# Patient Record
Sex: Male | Born: 1965 | Hispanic: Yes | Marital: Married | Smoking: Never smoker
Health system: Southern US, Community
[De-identification: ages and names within clinical notes are randomized; demographics above are authoritative.]

## PROBLEM LIST (undated history)

## (undated) DIAGNOSIS — C801 Malignant (primary) neoplasm, unspecified: Secondary | ICD-10-CM

## (undated) DIAGNOSIS — Z789 Other specified health status: Secondary | ICD-10-CM

## (undated) DIAGNOSIS — Z973 Presence of spectacles and contact lenses: Secondary | ICD-10-CM

## (undated) HISTORY — PX: SIGMOIDOSCOPY: SUR1295

## (undated) HISTORY — PX: TONSILLECTOMY: SUR1361

## (undated) HISTORY — PX: OTHER SURGICAL HISTORY: SHX169

## (undated) HISTORY — DX: Malignant (primary) neoplasm, unspecified: C80.1

## (undated) HISTORY — PX: WISDOM TOOTH EXTRACTION: SHX21

---

## 2012-11-13 ENCOUNTER — Encounter (INDEPENDENT_AMBULATORY_CARE_PROVIDER_SITE_OTHER): Payer: Self-pay

## 2012-11-21 ENCOUNTER — Encounter (INDEPENDENT_AMBULATORY_CARE_PROVIDER_SITE_OTHER): Payer: Self-pay | Admitting: General Surgery

## 2012-11-21 ENCOUNTER — Ambulatory Visit (INDEPENDENT_AMBULATORY_CARE_PROVIDER_SITE_OTHER): Payer: BC Managed Care – PPO | Admitting: General Surgery

## 2012-11-21 VITALS — BP 118/82 | HR 72 | Temp 98.6°F | Resp 18 | Ht 72.0 in | Wt 187.5 lb

## 2012-11-21 DIAGNOSIS — C4359 Malignant melanoma of other part of trunk: Secondary | ICD-10-CM

## 2012-11-21 NOTE — Patient Instructions (Addendum)
We will obtain lymphoscintigram and then plan on scheduling wide excision with sentinel lymph node biopsy

## 2012-11-21 NOTE — Progress Notes (Signed)
Patient ID: James Burns, male   DOB: [birth year], 47 y.o.   MRN: [id number]  Chief Complaint  Patient presents with  . Melanoma    HPI James Burns is a 47 y.o. male.  Chief complaint: Melanoma of right back HPI  Patient's wife is a Engineer, civil (consulting) and she noticed a mole that was bleeding on his back. This was just to the right of the midline. He went to have that checked by Dr. Yetta Barre. That was found to be a seborrheic keratosis. However, Dr. Yetta Barre noted another lesion more lateral on his right back. This was biopsied and came back malignant melanoma, superficial spreading type, restless measurement 0.62 mm, margins positive peripheral and deep area. I was asked to see him in consultation regarding surgical management and lymph node evaluation. Patient denies any complaints in the area.  History reviewed. No pertinent past medical history.  History reviewed. No pertinent past surgical history.  No family history on file.  Social History History  Substance Use Topics  . Smoking status: Never Smoker   . Smokeless tobacco: Not on file  . Alcohol Use: No    No Known Allergies  No current outpatient prescriptions on file.   No current facility-administered medications for this visit.    Review of Systems Review of Systems  Constitutional: Negative for fever, chills and unexpected weight change.  HENT: Negative for hearing loss, congestion, sore throat, trouble swallowing and voice change.   Eyes: Negative for visual disturbance.  Respiratory: Negative for cough and wheezing.   Cardiovascular: Negative for chest pain, palpitations and leg swelling.  Gastrointestinal: Negative for nausea, vomiting, abdominal pain, diarrhea, constipation, blood in stool, abdominal distention, anal bleeding and rectal pain.  Genitourinary: Negative for hematuria and difficulty urinating.  Musculoskeletal: Negative for arthralgias.  Skin: Positive for wound.       See history of present illness    Neurological: Negative for seizures, syncope, weakness and headaches.  Hematological: Negative for adenopathy. Does not bruise/bleed easily.  Psychiatric/Behavioral: Negative for confusion.    Blood pressure 118/82, pulse 72, temperature 98.6 F (37 C), temperature source Oral, resp. rate 18, height 6' (1.829 m), weight 187 lb 8 oz (85.049 kg).  Physical Exam Physical Exam  Constitutional: He is oriented to person, place, and time. He appears well-developed and well-nourished. No distress.  HENT:  Head: Normocephalic and atraumatic.  Mouth/Throat: Oropharynx is clear and moist. No oropharyngeal exudate.  Eyes: EOM are normal. Pupils are equal, round, and reactive to light. Right eye exhibits no discharge. Left eye exhibits no discharge. No scleral icterus.  Neck: Normal range of motion. Neck supple. No tracheal deviation present.  Cardiovascular: Normal rate, regular rhythm, normal heart sounds and intact distal pulses.   Pulmonary/Chest: Effort normal and breath sounds normal. No stridor. No respiratory distress. He has no wheezes. He has no rales.    Biopsy site right mid back lateral to the seborrheic keratosis, no surrounding nodularity, a picture was taken and is listed separately in EPIC, no evidence of infection   Abdominal: Soft. Bowel sounds are normal. He exhibits no distension and no mass. There is no tenderness. There is no rebound and no guarding.  Musculoskeletal: Normal range of motion.  Neurological: He is alert and oriented to person, place, and time. He exhibits normal muscle tone. Coordination normal.  Skin: He is not diaphoretic.  See above, scattered nevi on the trunk  Lymphatic exam: No bilateral cervical, supraclavicular, axillary, or inguinal lymphadenopathy  Data Reviewed Office note and pathology  report from Dr. Yetta Barre  Assessment    Melanoma right back, at least 0.62 mm in thickness    Plan    We'll obtain lymphoscintigram to evaluate location of  sentinel node. We'll plan wide excision melanoma right back with sentinel lymph node biopsy, likely from the right axilla. The location of the sentinel node biopsy will be guided by the  lymphoscintigram. We will schedule this for next week.  Procedure, risks, and benefits were discussed in detail with the patient. We also discussed the disease process in detail. I gave him some literature from the [employer]. I answered his questions and his wife's questions. I look forward to proceeding with surgery.       THOMPSON,BURKE E 11/21/2012, 11:38 AM

## 2012-11-23 ENCOUNTER — Encounter (HOSPITAL_BASED_OUTPATIENT_CLINIC_OR_DEPARTMENT_OTHER): Payer: Self-pay | Admitting: *Deleted

## 2012-11-23 NOTE — Progress Notes (Signed)
Talked with wife-she is nurse-they moved from [facility] No labs needed

## 2012-11-28 ENCOUNTER — Encounter (HOSPITAL_COMMUNITY)
Admission: RE | Admit: 2012-11-28 | Discharge: 2012-11-28 | Disposition: A | Discharge status: Home / Self Care | Payer: BC Managed Care – PPO | Source: Ambulatory Visit | Attending: General Surgery | Admitting: General Surgery

## 2012-11-28 DIAGNOSIS — C4359 Malignant melanoma of other part of trunk: Secondary | ICD-10-CM

## 2012-11-28 MED ORDER — TECHNETIUM TC 99M SULFUR COLLOID FILTERED
0.5000 | Freq: Once | INTRAVENOUS | Status: AC | PRN
  Administered 2012-11-28: 0.5 via INTRADERMAL

## 2012-11-29 ENCOUNTER — Telehealth: Payer: Self-pay | Admitting: General Surgery

## 2012-11-29 NOTE — Telephone Encounter (Signed)
I called patient and told him the results of his lymphscintigram.  Surgery tomorrow as planned.

## 2012-11-30 ENCOUNTER — Ambulatory Visit (HOSPITAL_BASED_OUTPATIENT_CLINIC_OR_DEPARTMENT_OTHER)
Admission: RE | Admit: 2012-11-30 | Discharge: 2012-11-30 | Disposition: A | Discharge status: Home / Self Care | Payer: BC Managed Care – PPO | Source: Ambulatory Visit | Attending: General Surgery | Admitting: General Surgery

## 2012-11-30 ENCOUNTER — Encounter (HOSPITAL_COMMUNITY)
Admission: RE | Admit: 2012-11-30 | Discharge: 2012-11-30 | Disposition: A | Discharge status: Home / Self Care | Payer: BC Managed Care – PPO | Source: Ambulatory Visit | Attending: General Surgery | Admitting: General Surgery

## 2012-11-30 ENCOUNTER — Encounter (HOSPITAL_BASED_OUTPATIENT_CLINIC_OR_DEPARTMENT_OTHER): Payer: Self-pay | Admitting: *Deleted

## 2012-11-30 ENCOUNTER — Encounter (HOSPITAL_BASED_OUTPATIENT_CLINIC_OR_DEPARTMENT_OTHER): Payer: Self-pay | Admitting: Anesthesiology

## 2012-11-30 ENCOUNTER — Ambulatory Visit (HOSPITAL_BASED_OUTPATIENT_CLINIC_OR_DEPARTMENT_OTHER): Payer: BC Managed Care – PPO | Admitting: Anesthesiology

## 2012-11-30 ENCOUNTER — Telehealth (INDEPENDENT_AMBULATORY_CARE_PROVIDER_SITE_OTHER): Payer: Self-pay | Admitting: General Surgery

## 2012-11-30 ENCOUNTER — Encounter (HOSPITAL_BASED_OUTPATIENT_CLINIC_OR_DEPARTMENT_OTHER)
Admission: RE | Disposition: A | Discharge status: Home / Self Care | Payer: Self-pay | Source: Ambulatory Visit | Attending: General Surgery

## 2012-11-30 DIAGNOSIS — C4359 Malignant melanoma of other part of trunk: Secondary | ICD-10-CM

## 2012-11-30 HISTORY — PX: MELANOMA EXCISION WITH SENTINEL LYMPH NODE BIOPSY: SHX5267

## 2012-11-30 HISTORY — DX: Other specified health status: Z78.9

## 2012-11-30 HISTORY — DX: Presence of spectacles and contact lenses: Z97.3

## 2012-11-30 LAB — POCT HEMOGLOBIN-HEMACUE: Hemoglobin: 16 g/dL (ref 13.0–17.0)

## 2012-11-30 SURGERY — MELANOMA EXCISION WITH SENTINEL LYMPH NODE BIOPSY
Anesthesia: General | Site: Back | Laterality: Right | Wound class: Clean

## 2012-11-30 MED ORDER — CHLORHEXIDINE GLUCONATE 4 % EX LIQD: 1.0000 "application " | Freq: Once | CUTANEOUS | Status: DC

## 2012-11-30 MED ORDER — FENTANYL CITRATE 0.05 MG/ML IJ SOLN
INTRAMUSCULAR | Status: DC | PRN
  Administered 2012-11-30 (×4): 25 ug via INTRAVENOUS

## 2012-11-30 MED ORDER — FENTANYL CITRATE 0.05 MG/ML IJ SOLN
50.0000 ug | INTRAMUSCULAR | Status: DC | PRN
  Administered 2012-11-30: 100 ug via INTRAVENOUS

## 2012-11-30 MED ORDER — BUPIVACAINE-EPINEPHRINE 0.5% -1:200000 IJ SOLN
INTRAMUSCULAR | Status: DC | PRN
  Administered 2012-11-30: 20 mL
  Administered 2012-11-30: 6 mL

## 2012-11-30 MED ORDER — SUCCINYLCHOLINE CHLORIDE 20 MG/ML IJ SOLN
INTRAMUSCULAR | Status: DC | PRN
  Administered 2012-11-30: 100 mg via INTRAVENOUS

## 2012-11-30 MED ORDER — OXYCODONE-ACETAMINOPHEN 5-325 MG PO TABS: 1.0000 | ORAL_TABLET | Freq: Four times a day (QID) | ORAL | Status: DC | PRN

## 2012-11-30 MED ORDER — DEXAMETHASONE SODIUM PHOSPHATE 4 MG/ML IJ SOLN
INTRAMUSCULAR | Status: DC | PRN
  Administered 2012-11-30: 10 mg via INTRAVENOUS

## 2012-11-30 MED ORDER — OXYCODONE HCL 5 MG PO TABS
5.0000 mg | ORAL_TABLET | Freq: Once | ORAL | Status: AC | PRN
  Administered 2012-11-30: 5 mg via ORAL

## 2012-11-30 MED ORDER — PROPOFOL 10 MG/ML IV BOLUS
INTRAVENOUS | Status: DC | PRN
  Administered 2012-11-30: 160 mg via INTRAVENOUS

## 2012-11-30 MED ORDER — TECHNETIUM TC 99M SULFUR COLLOID FILTERED
1.0000 | Freq: Once | INTRAVENOUS | Status: AC | PRN
  Administered 2012-11-30: 1 via INTRADERMAL

## 2012-11-30 MED ORDER — LIDOCAINE HCL (CARDIAC) 10 MG/ML IV SOLN
INTRAVENOUS | Status: DC | PRN
  Administered 2012-11-30: 50 mg via INTRAVENOUS

## 2012-11-30 MED ORDER — OXYCODONE HCL 5 MG/5ML PO SOLN
5.0000 mg | Freq: Once | ORAL | Status: AC | PRN
Start: 2012-11-30 — End: 2012-11-30

## 2012-11-30 MED ORDER — MIDAZOLAM HCL 2 MG/2ML IJ SOLN
2.0000 mg | INTRAMUSCULAR | Status: DC | PRN
  Administered 2012-11-30: 2 mg via INTRAVENOUS

## 2012-11-30 MED ORDER — CEFAZOLIN SODIUM-DEXTROSE 2-3 GM-% IV SOLR
2.0000 g | INTRAVENOUS | Status: AC
  Administered 2012-11-30: 2 g via INTRAVENOUS

## 2012-11-30 MED ORDER — LACTATED RINGERS IV SOLN
INTRAVENOUS | Status: DC
  Administered 2012-11-30 (×3): via INTRAVENOUS

## 2012-11-30 MED ORDER — HYDROMORPHONE HCL PF 1 MG/ML IJ SOLN: 0.2500 mg | INTRAMUSCULAR | Status: DC | PRN

## 2012-11-30 MED ORDER — ONDANSETRON HCL 4 MG/2ML IJ SOLN
INTRAMUSCULAR | Status: DC | PRN
  Administered 2012-11-30: 4 mg via INTRAVENOUS

## 2012-11-30 MED ORDER — METHYLENE BLUE 1 % INJ SOLN
INTRAMUSCULAR | Status: DC | PRN
  Administered 2012-11-30: 2 mL via INTRADERMAL

## 2012-11-30 SURGICAL SUPPLY — 62 items
APPLIER CLIP 11 MED OPEN (CLIP)
APPLIER CLIP 9.375 MED OPEN (MISCELLANEOUS)
BANDAGE GAUZE ELAST BULKY 4 IN (GAUZE/BANDAGES/DRESSINGS) IMPLANT
BENZOIN TINCTURE PRP APPL 2/3 (GAUZE/BANDAGES/DRESSINGS) IMPLANT
BLADE HEX COATED 2.75 (ELECTRODE) ×2 IMPLANT
BLADE SURG 10 STRL SS (BLADE) IMPLANT
BLADE SURG 15 STRL LF DISP TIS (BLADE) ×1 IMPLANT
BLADE SURG 15 STRL SS (BLADE) ×1
BLADE SURG ROTATE 9660 (MISCELLANEOUS) ×4 IMPLANT
CANISTER SUCTION 1200CC (MISCELLANEOUS) ×2 IMPLANT
CHLORAPREP W/TINT 26ML (MISCELLANEOUS) ×4 IMPLANT
CLIP APPLIE 11 MED OPEN (CLIP) IMPLANT
CLIP APPLIE 9.375 MED OPEN (MISCELLANEOUS) IMPLANT
CLOTH BEACON ORANGE TIMEOUT ST (SAFETY) ×2 IMPLANT
COVER MAYO STAND STRL (DRAPES) ×2 IMPLANT
COVER PROBE W GEL 5X96 (DRAPES) ×2 IMPLANT
COVER TABLE BACK 60X90 (DRAPES) ×2 IMPLANT
DECANTER SPIKE VIAL GLASS SM (MISCELLANEOUS) IMPLANT
DERMABOND ADVANCED (GAUZE/BANDAGES/DRESSINGS) ×1
DERMABOND ADVANCED .7 DNX12 (GAUZE/BANDAGES/DRESSINGS) ×1 IMPLANT
DRAPE LAPAROSCOPIC ABDOMINAL (DRAPES) ×2 IMPLANT
DRAPE PED LAPAROTOMY (DRAPES) ×2 IMPLANT
DRAPE UTILITY XL STRL (DRAPES) ×4 IMPLANT
ELECT REM PT RETURN 9FT ADLT (ELECTROSURGICAL) ×2
ELECTRODE REM PT RTRN 9FT ADLT (ELECTROSURGICAL) ×1 IMPLANT
GAUZE SPONGE 4X4 12PLY STRL LF (GAUZE/BANDAGES/DRESSINGS) IMPLANT
GAUZE XEROFORM 1X8 LF (GAUZE/BANDAGES/DRESSINGS) IMPLANT
GLOVE BIO SURGEON STRL SZ 6.5 (GLOVE) ×2 IMPLANT
GLOVE BIO SURGEON STRL SZ8 (GLOVE) ×6 IMPLANT
GLOVE BIOGEL PI IND STRL 8 (GLOVE) ×2 IMPLANT
GLOVE BIOGEL PI INDICATOR 8 (GLOVE) ×2
GLOVE EXAM NITRILE MD LF STRL (GLOVE) ×2 IMPLANT
GOWN PREVENTION PLUS XLARGE (GOWN DISPOSABLE) ×6 IMPLANT
GOWN PREVENTION PLUS XXLARGE (GOWN DISPOSABLE) IMPLANT
NDL SAFETY ECLIPSE 18X1.5 (NEEDLE) ×1 IMPLANT
NEEDLE HYPO 18GX1.5 SHARP (NEEDLE) ×1
NEEDLE HYPO 25X1 1.5 SAFETY (NEEDLE) ×4 IMPLANT
NEEDLE HYPO 25X5/8 SAFETYGLIDE (NEEDLE) IMPLANT
NS IRRIG 1000ML POUR BTL (IV SOLUTION) ×2 IMPLANT
PACK BASIN DAY SURGERY FS (CUSTOM PROCEDURE TRAY) ×2 IMPLANT
PENCIL BUTTON HOLSTER BLD 10FT (ELECTRODE) ×2 IMPLANT
SLEEVE SCD COMPRESS KNEE MED (MISCELLANEOUS) ×2 IMPLANT
SPONGE GAUZE 4X4 12PLY (GAUZE/BANDAGES/DRESSINGS) ×2 IMPLANT
SPONGE LAP 4X18 X RAY DECT (DISPOSABLE) ×2 IMPLANT
STAPLER VISISTAT 35W (STAPLE) IMPLANT
STOCKINETTE IMPERVIOUS LG (DRAPES) IMPLANT
STRIP CLOSURE SKIN 1/2X4 (GAUZE/BANDAGES/DRESSINGS) IMPLANT
STRIP CLOSURE SKIN 1/4X4 (GAUZE/BANDAGES/DRESSINGS) IMPLANT
SUT ETHILON 3 0 FSL (SUTURE) ×4 IMPLANT
SUT ETHILON 4 0 PS 2 18 (SUTURE) IMPLANT
SUT MON AB 4-0 PC3 18 (SUTURE) ×2 IMPLANT
SUT SILK 2 0 SH (SUTURE) ×2 IMPLANT
SUT VIC AB 2-0 SH 27 (SUTURE) ×2
SUT VIC AB 2-0 SH 27XBRD (SUTURE) ×2 IMPLANT
SUT VIC AB 3-0 SH 27 (SUTURE) ×2
SUT VIC AB 3-0 SH 27X BRD (SUTURE) ×2 IMPLANT
SYR CONTROL 10ML LL (SYRINGE) ×4 IMPLANT
SYR TB 1ML LL NO SAFETY (SYRINGE) IMPLANT
TOWEL OR 17X24 6PK STRL BLUE (TOWEL DISPOSABLE) ×2 IMPLANT
TOWEL OR NON WOVEN STRL DISP B (DISPOSABLE) ×2 IMPLANT
TUBE CONNECTING 20X1/4 (TUBING) ×2 IMPLANT
YANKAUER SUCT BULB TIP NO VENT (SUCTIONS) ×2 IMPLANT

## 2012-11-30 NOTE — Transfer of Care (Signed)
Immediate Anesthesia Transfer of Care Note  Patient: James Burns  Procedure(s) Performed: Procedure(s) with comments: MELANOMA wide EXCISION right back  WITH SENTINEL LYMPH NODE BIOPSY nuclear medicine injection 7:00 (Right) - melanoma nuclear medicine injection at 7:00 per Sheralyn Boatman   Patient Location: PACU  Anesthesia Type:General  Level of Consciousness: sedated and patient cooperative  Airway & Oxygen Therapy: Patient Spontanous Breathing and Patient connected to face mask oxygen  Post-op Assessment: Report given to PACU RN and Post -op Vital signs reviewed and stable  Post vital signs: Reviewed and stable  Complications: No apparent anesthesia complications

## 2012-11-30 NOTE — Telephone Encounter (Signed)
Left message on this patients voicemail po f/u is 12/19/12 9:15am

## 2012-11-30 NOTE — H&P (View-Only) (Signed)
Patient ID: James Burns, male   DOB: 12/26/1965, 47 y.o.   MRN: [id number]  Chief Complaint  Patient presents with  . Melanoma    HPI Printice Korff is a 47 y.o. male.  Chief complaint: Melanoma of right back HPI  Patient's wife is a nurse and she noticed a mole that was bleeding on his back. This was just to the right of the midline. He went to have that checked by Dr. Jones. That was found to be a seborrheic keratosis. However, Dr. Jones noted another lesion more lateral on his right back. This was biopsied and came back malignant melanoma, superficial spreading type, restless measurement 0.62 mm, margins positive peripheral and deep area. I was asked to see him in consultation regarding surgical management and lymph node evaluation. Patient denies any complaints in the area.  History reviewed. No pertinent past medical history.  History reviewed. No pertinent past surgical history.  No family history on file.  Social History History  Substance Use Topics  . Smoking status: Never Smoker   . Smokeless tobacco: Not on file  . Alcohol Use: No    No Known Allergies  No current outpatient prescriptions on file.   No current facility-administered medications for this visit.    Review of Systems Review of Systems  Constitutional: Negative for fever, chills and unexpected weight change.  HENT: Negative for hearing loss, congestion, sore throat, trouble swallowing and voice change.   Eyes: Negative for visual disturbance.  Respiratory: Negative for cough and wheezing.   Cardiovascular: Negative for chest pain, palpitations and leg swelling.  Gastrointestinal: Negative for nausea, vomiting, abdominal pain, diarrhea, constipation, blood in stool, abdominal distention, anal bleeding and rectal pain.  Genitourinary: Negative for hematuria and difficulty urinating.  Musculoskeletal: Negative for arthralgias.  Skin: Positive for wound.       See history of present illness    Neurological: Negative for seizures, syncope, weakness and headaches.  Hematological: Negative for adenopathy. Does not bruise/bleed easily.  Psychiatric/Behavioral: Negative for confusion.    Blood pressure 118/82, pulse 72, temperature 98.6 F (37 C), temperature source Oral, resp. rate 18, height 6' (1.829 m), weight 187 lb 8 oz (85.049 kg).  Physical Exam Physical Exam  Constitutional: He is oriented to person, place, and time. He appears well-developed and well-nourished. No distress.  HENT:  Head: Normocephalic and atraumatic.  Mouth/Throat: Oropharynx is clear and moist. No oropharyngeal exudate.  Eyes: EOM are normal. Pupils are equal, round, and reactive to light. Right eye exhibits no discharge. Left eye exhibits no discharge. No scleral icterus.  Neck: Normal range of motion. Neck supple. No tracheal deviation present.  Cardiovascular: Normal rate, regular rhythm, normal heart sounds and intact distal pulses.   Pulmonary/Chest: Effort normal and breath sounds normal. No stridor. No respiratory distress. He has no wheezes. He has no rales.    Biopsy site right mid back lateral to the seborrheic keratosis, no surrounding nodularity, a picture was taken and is listed separately in EPIC, no evidence of infection   Abdominal: Soft. Bowel sounds are normal. He exhibits no distension and no mass. There is no tenderness. There is no rebound and no guarding.  Musculoskeletal: Normal range of motion.  Neurological: He is alert and oriented to person, place, and time. He exhibits normal muscle tone. Coordination normal.  Skin: He is not diaphoretic.  See above, scattered nevi on the trunk  Lymphatic exam: No bilateral cervical, supraclavicular, axillary, or inguinal lymphadenopathy  Data Reviewed Office note and pathology   report from Dr. Jones  Assessment    Melanoma right back, at least 0.62 mm in thickness    Plan    We'll obtain lymphoscintigram to evaluate location of  sentinel node. We'll plan wide excision melanoma right back with sentinel lymph node biopsy, likely from the right axilla. The location of the sentinel node biopsy will be guided by the  lymphoscintigram. We will schedule this for next week.  Procedure, risks, and benefits were discussed in detail with the patient. We also discussed the disease process in detail. I gave him some literature from the [employer] Society. I answered his questions and his wife's questions. I look forward to proceeding with surgery.       THOMPSON,BURKE E 11/21/2012, 11:38 AM    

## 2012-11-30 NOTE — Anesthesia Procedure Notes (Signed)
Procedure Name: Intubation Date/Time: 11/30/2012 8:51 AM Performed by: Gar Gibbon Pre-anesthesia Checklist: Patient identified, Emergency Drugs available, Suction available and Patient being monitored Patient Re-evaluated:Patient Re-evaluated prior to inductionOxygen Delivery Method: Circle System Utilized Preoxygenation: Pre-oxygenation with 100% oxygen Intubation Type: IV induction Ventilation: Mask ventilation without difficulty Laryngoscope Size: Miller and 3 Grade View: Grade II Tube type: Oral Tube size: 8.0 mm Number of attempts: 1 Airway Equipment and Method: stylet and oral airway Placement Confirmation: ETT inserted through vocal cords under direct vision,  positive ETCO2 and breath sounds checked- equal and bilateral Secured at: 22 cm Tube secured with: Tape Dental Injury: Teeth and Oropharynx as per pre-operative assessment

## 2012-11-30 NOTE — Op Note (Signed)
11/30/2012  10:17 AM  PATIENT:  James Burns  47 y.o. male  PRE-OPERATIVE DIAGNOSIS:  melanoma right back   POST-OPERATIVE DIAGNOSIS:  melanoma right back  PROCEDURE:  Procedure(s): MELANOMA wide EXCISION right back 10x4.5cm with layered closure WITH SENTINEL LYMPH NODE BIOPSY right axilla with blue dye injection, nuclear medicine injection 7:00  SURGEON:  Surgeon(s): Liz Malady, MD  PHYSICIAN ASSISTANT:   ASSISTANTS: none   ANESTHESIA:   local and general  EBL:  Total I/O In: 1000 [I.V.:1000] Out: -   BLOOD ADMINISTERED:none  DRAINS: none   SPECIMEN:  Excision  DISPOSITION OF SPECIMEN:  PATHOLOGY  COUNTS:  YES  DICTATION: .Dragon Dictation  Patient presented for wide excision melanoma right back with right axillary sentinel lymph node biopsy. Lymphoscintigram revealed sentinel node in the right axilla. He was identified in the preop holding area. Informed consent was obtained. His sites were both marked. He was brought to the operating room. He received intravenous antibiotics. General endotracheal anesthesia was administered by the anesthesia staff. We did a time out procedure. Methylene blue diluted with saline was injected cutaneously around his melanoma site and this was massaged for a couple minutes. Next, his right axilla was prepped and draped in sterile fashion. Neoprobe was used to locate sentinel node. Transverse incision was made. Subcutaneous tissues were dissected down with cautery. Axillary fat was entered. Using the neoprobe, a hot blue node was encountered. This was carefully dissected using Bovie cautery. Dissection stayed centrally and away from Long thoracic and thoracodorsal nerves. Hot blue node was circumferentially dissected and removed. Cautery was used on small vessels and lymphatics. Left eye was sent to pathology. The axilla was further checked with the neoprobe. No readings above 10% of the previous node reading which was 650 were obtained.  Wound was copiously irrigated. Hemostasis was ensured. Local anesthetic was injected. Subcutaneous tissues were approximated with running 3-0 Vicryl. Skin was closed with running 4-0 Monocryl subcuticular followed by Dermabond. Counts were correct for this portion. Next the patient was undraped and repositioned. His back was prepped and draped in sterile fashion. I changed my gown and gloves. An area was measured out around the biopsy site on his back giving at least approximately 2 cm circumferential margin. An elliptical incision was then made, 10 x 4.5 cm along tissue planes to facilitate closure. The incision was continued down through subcutaneous fat and to the fascia. This Tissue was removed in its entirety down to the fascial level. It was marked for orientation with silk suture for pathology and passed off. We changed our gloves. Flaps were raised along the fascia superiorly and inferiorly using cautery. Good hemostasis was obtained. Local anesthetic was injected. The wound was then closed with deep tissues approximated with interrupted 2-0 Vicryl sutures. Skin was closed with interrupted 3-0 nylon sutures. It came together nicely without excessive tension. There was excellent hemostasis. Sterile dressing was applied. Sponge, needle, and as we counts were all correct. Patient tolerated procedure well without apparent complications to recovery in stable condition.  PATIENT DISPOSITION:  PACU - hemodynamically stable.   Delay start of Pharmacological VTE agent (>24hrs) due to surgical blood loss or risk of bleeding:  no  Violeta Gelinas, MD, MPH, FACS Pager: [phone]  3/14/201410:17 AM

## 2012-11-30 NOTE — Anesthesia Preprocedure Evaluation (Signed)
Anesthesia Evaluation  Patient identified by MRN, date of birth, ID band Patient awake    Reviewed: Allergy & Precautions, H&P , NPO status , Patient's Chart, lab work & pertinent test results  Airway Mallampati: II TM Distance: >3 FB Neck ROM: Full    Dental no notable dental hx. (+) Teeth Intact and Dental Advisory Given   Pulmonary neg pulmonary ROS,  breath sounds clear to auscultation  Pulmonary exam normal       Cardiovascular negative cardio ROS  Rhythm:Regular Rate:Normal     Neuro/Psych negative neurological ROS  negative psych ROS   GI/Hepatic negative GI ROS, Neg liver ROS,   Endo/Other  negative endocrine ROS  Renal/GU negative Renal ROS  negative genitourinary   Musculoskeletal   Abdominal   Peds  Hematology negative hematology ROS (+)   Anesthesia Other Findings   Reproductive/Obstetrics negative OB ROS                           Anesthesia Physical Anesthesia Plan  ASA: I  Anesthesia Plan: General   Post-op Pain Management:    Induction: Intravenous  Airway Management Planned: Oral ETT  Additional Equipment:   Intra-op Plan:   Post-operative Plan: Extubation in OR  Informed Consent: I have reviewed the patients History and Physical, chart, labs and discussed the procedure including the risks, benefits and alternatives for the proposed anesthesia with the patient or authorized representative who has indicated his/her understanding and acceptance.   Dental advisory given  Plan Discussed with: CRNA  Anesthesia Plan Comments:         Anesthesia Quick Evaluation  

## 2012-11-30 NOTE — Interval H&P Note (Signed)
History and Physical Interval Note:  11/30/2012 8:12 AM  James Burns  has presented today for surgery, with the diagnosis of melanoma right back   The various methods of treatment have been discussed with the patient and family. After consideration of risks, benefits and other options for treatment, the patient has consented to  Procedure(s) with comments: MELANOMA wide EXCISION right back  WITH SENTINEL LYMPH NODE BIOPSY nuclear medicine injection 7:00 (Right) - melanoma nuclear medicine injection at 7:00 per Sheralyn Boatman  as a surgical intervention .  The patient's history has been reviewed, patient re-examined, site marked, no change in status, stable for surgery.  I have reviewed the patient's chart and labs.  Questions were answered to the patient's satisfaction.     THOMPSON,BURKE E

## 2012-11-30 NOTE — Anesthesia Postprocedure Evaluation (Signed)
  Anesthesia Post-op Note  Patient: James Burns  Procedure(s) Performed: Procedure(s) with comments: MELANOMA wide EXCISION right back  WITH SENTINEL LYMPH NODE BIOPSY nuclear medicine injection 7:00 (Right) - melanoma nuclear medicine injection at 7:00 per Sheralyn Boatman   Patient Location: PACU  Anesthesia Type:General  Level of Consciousness: awake and alert   Airway and Oxygen Therapy: Patient Spontanous Breathing and Patient connected to face mask oxygen  Post-op Pain: none  Post-op Assessment: Post-op Vital signs reviewed, Patient's Cardiovascular Status Stable, Respiratory Function Stable, Patent Airway and No signs of Nausea or vomiting  Post-op Vital Signs: Reviewed and stable  Complications: No apparent anesthesia complications

## 2012-12-03 ENCOUNTER — Encounter (HOSPITAL_BASED_OUTPATIENT_CLINIC_OR_DEPARTMENT_OTHER): Payer: Self-pay | Admitting: General Surgery

## 2012-12-06 ENCOUNTER — Telehealth (INDEPENDENT_AMBULATORY_CARE_PROVIDER_SITE_OTHER): Payer: Self-pay

## 2012-12-06 NOTE — Telephone Encounter (Signed)
I called the pt and let him know about his pathology results.  He has follow up 4/2

## 2012-12-19 ENCOUNTER — Ambulatory Visit (INDEPENDENT_AMBULATORY_CARE_PROVIDER_SITE_OTHER): Payer: BC Managed Care – PPO | Admitting: General Surgery

## 2012-12-19 ENCOUNTER — Encounter (INDEPENDENT_AMBULATORY_CARE_PROVIDER_SITE_OTHER): Payer: Self-pay | Admitting: General Surgery

## 2012-12-19 ENCOUNTER — Telehealth: Payer: Self-pay | Admitting: Hematology & Oncology

## 2012-12-19 VITALS — BP 122/84 | HR 69 | Temp 97.7°F | Resp 14 | Ht 72.0 in | Wt 186.4 lb

## 2012-12-19 DIAGNOSIS — C4359 Malignant melanoma of other part of trunk: Secondary | ICD-10-CM

## 2012-12-19 NOTE — Telephone Encounter (Signed)
Pt made 5-5 appointment, had other dates but he has plans and wanted this day.

## 2012-12-19 NOTE — Progress Notes (Signed)
Subjective:     Patient ID: James Burns, male   DOB: January 11, 1966, 47 y.o.   MRN: [id number]  HPI Patient is status post wide excision melanoma right back with right axillary sentinel lymph node biopsy. Pathology came back with lymph node negative and no evidence of residual melanoma. He is doing well postoperatively.  Review of Systems     Objective:   Physical Exam He is a small amount of edema in his right axilla near the wound but no evidence of infection. No significant seroma is present. Excision site on right back is well healed. Sutures were removed without difficulty. Benzoin and Steri-Strips was applied. No evidence of infection.    Assessment:     Status post wide excision melanoma right back with right axillary sentinel lymph node biopsy    Plan:     Favorable pathology as above. I will refer him to see Dr. Myna Hidalgo for medical oncology. I will see him back in one month.

## 2013-01-15 ENCOUNTER — Telehealth (INDEPENDENT_AMBULATORY_CARE_PROVIDER_SITE_OTHER): Payer: Self-pay

## 2013-01-15 ENCOUNTER — Telehealth: Payer: Self-pay | Admitting: Hematology & Oncology

## 2013-01-15 ENCOUNTER — Telehealth (INDEPENDENT_AMBULATORY_CARE_PROVIDER_SITE_OTHER): Payer: Self-pay | Admitting: General Surgery

## 2013-01-15 DIAGNOSIS — C4359 Malignant melanoma of other part of trunk: Secondary | ICD-10-CM

## 2013-01-15 NOTE — Telephone Encounter (Signed)
Patient called and cx 01/21/13 New patient apt.  He stated, he did not want to resch at this time

## 2013-01-15 NOTE — Telephone Encounter (Signed)
I called the pt back and he thinks Dr Gustavo Lah office is too far out for him to drive.  He would like to see someone closer to here.  I will discuss with Dr Janee Morn to see if there is an oncologist in [place] that he could see.

## 2013-01-15 NOTE — Telephone Encounter (Signed)
Message copied by Ivory Broad on Tue Jan 15, 2013  2:49 PM ------      Message from: Leanne Chang      Created: Tue Jan 15, 2013 10:14 AM      Regarding: Dr Janee Morn      Contact: [phone]       Patient states the referral given to him is too far and he would like a new one. May be e-mailed @ [email or web address] or called. ------

## 2013-01-15 NOTE — Telephone Encounter (Signed)
Spoke with this patient he is aware that the [facility] is looking for and open appt  Selena Batten said she would contact him with a day and time as well as contact us at [facility] . The patient is ok with that .

## 2013-01-18 ENCOUNTER — Telehealth: Payer: Self-pay | Admitting: Oncology

## 2013-01-18 NOTE — Telephone Encounter (Signed)
LVOM FOR PT TO RETURN CALL IN RE TO NP APPT.  °

## 2013-01-21 ENCOUNTER — Ambulatory Visit: Payer: BC Managed Care – PPO

## 2013-01-21 ENCOUNTER — Ambulatory Visit: Payer: BC Managed Care – PPO | Admitting: Hematology & Oncology

## 2013-01-21 ENCOUNTER — Other Ambulatory Visit: Payer: BC Managed Care – PPO | Admitting: Lab

## 2013-01-22 ENCOUNTER — Other Ambulatory Visit: Payer: Self-pay | Admitting: Oncology

## 2013-01-22 DIAGNOSIS — C4359 Malignant melanoma of other part of trunk: Secondary | ICD-10-CM

## 2013-01-23 ENCOUNTER — Telehealth: Payer: Self-pay | Admitting: Oncology

## 2013-01-23 ENCOUNTER — Encounter (INDEPENDENT_AMBULATORY_CARE_PROVIDER_SITE_OTHER): Payer: Self-pay | Admitting: General Surgery

## 2013-01-23 ENCOUNTER — Other Ambulatory Visit (HOSPITAL_BASED_OUTPATIENT_CLINIC_OR_DEPARTMENT_OTHER): Payer: BC Managed Care – PPO | Admitting: Lab

## 2013-01-23 ENCOUNTER — Ambulatory Visit (HOSPITAL_COMMUNITY)
Admission: RE | Admit: 2013-01-23 | Discharge: 2013-01-23 | Disposition: A | Discharge status: Home / Self Care | Payer: BC Managed Care – PPO | Source: Ambulatory Visit | Attending: Oncology | Admitting: Oncology

## 2013-01-23 ENCOUNTER — Ambulatory Visit: Payer: BC Managed Care – PPO

## 2013-01-23 ENCOUNTER — Encounter: Payer: Self-pay | Admitting: Oncology

## 2013-01-23 ENCOUNTER — Ambulatory Visit (INDEPENDENT_AMBULATORY_CARE_PROVIDER_SITE_OTHER): Payer: BC Managed Care – PPO | Admitting: General Surgery

## 2013-01-23 ENCOUNTER — Ambulatory Visit (HOSPITAL_BASED_OUTPATIENT_CLINIC_OR_DEPARTMENT_OTHER): Payer: BC Managed Care – PPO | Admitting: Oncology

## 2013-01-23 VITALS — BP 132/86 | HR 95 | Temp 98.5°F | Resp 18 | Ht 72.0 in | Wt 186.1 lb

## 2013-01-23 VITALS — BP 122/80 | HR 72 | Temp 97.9°F | Resp 18 | Ht 72.0 in | Wt 184.5 lb

## 2013-01-23 DIAGNOSIS — C4359 Malignant melanoma of other part of trunk: Secondary | ICD-10-CM

## 2013-01-23 LAB — CBC WITH DIFFERENTIAL/PLATELET
BASO%: 0.5 % (ref 0.0–2.0)
LYMPH%: 28.1 % (ref 14.0–49.0)
MCHC: 33.2 g/dL (ref 32.0–36.0)
MCV: 83.4 fL (ref 79.3–98.0)
MONO#: 0.6 10*3/uL (ref 0.1–0.9)
MONO%: 10.4 % (ref 0.0–14.0)
Platelets: 211 10*3/uL (ref 140–400)
RBC: 5.24 10*6/uL (ref 4.20–5.82)
WBC: 5.4 10*3/uL (ref 4.0–10.3)

## 2013-01-23 LAB — COMPREHENSIVE METABOLIC PANEL (CC13)
ALT: 29 U/L (ref 0–55)
Alkaline Phosphatase: 77 U/L (ref 40–150)
Sodium: 138 mEq/L (ref 136–145)
Total Bilirubin: 0.49 mg/dL (ref 0.20–1.20)
Total Protein: 7.3 g/dL (ref 6.4–8.3)

## 2013-01-23 NOTE — Progress Notes (Addendum)
Subjective:     Patient ID: James Burns, male   DOB: 30-Apr-1966, 48 y.o.   MRN: [id number]  HPI Patient presents for followup status post wide excision melanoma right back and right axillary sentinel node biopsy. He will be evaluated by medical oncology today. He is feeling fine. His wife has been putting the cocoa butter on his scars. No complaints.  Review of Systems     Objective:   Physical Exam Right back incision is well healed. No signs of infection or complicating features. Right axillary incision, similarly, is well-healed. No swelling or signs of infection.    Assessment:     Doing very well status post wide excision melanoma right back with right axillary sentinel node biopsy - T1 N0 MX    Plan:     Medical oncology evaluation today. I was him back in 3 months. I advised him on scar reduction techniques. Additionally, he will need to followup with Dr. Yetta Barre on a yearly basis. This is usually alternated with oncology followup. I answered his questions.

## 2013-01-23 NOTE — Progress Notes (Signed)
Note dictated

## 2013-01-23 NOTE — Progress Notes (Signed)
Checked in new pt with no financial concerns. °

## 2013-01-23 NOTE — Telephone Encounter (Signed)
gv and printed appt sched and avs for pt for Nov...sent pt to radiology

## 2013-01-24 ENCOUNTER — Telehealth: Payer: Self-pay | Admitting: *Deleted

## 2013-01-24 NOTE — Telephone Encounter (Signed)
Spoke with patient, gave results of chest x-ray done 01/23/13

## 2013-01-24 NOTE — Progress Notes (Signed)
CC:   James Burns, M.D. Gabrielle Dare Janee Morn, M.D.  REASON FOR CONSULTATION:  Recent diagnosis of melanoma.  HISTORY OF PRESENT ILLNESS:  James Burns is a 47 year old gentleman, native of [place], lived the majority of his life around that area, and relocated to [place] in the last 6 years.  Currently works as a Best boy.  He is a healthy man, without really any significant comorbid conditions.  Around January 2014 his wife noted a lesion/mole in the middle of his back.  The patient was evaluated by his primary care physician and subsequently referred to Dermatology.  He had a biopsy on 10/25/2012.  The pathology showed a malignant melanoma, a superficial spreading type, Clark level III, of 0.62 mm.  There was focal ulceration noted, and the pathological staging was stage IB.  The patient subsequently was referred to Dr. Janee Morn, who performed a wide excision and a sentinel lymph node biopsy done on 11/30/2012.  The pathology from that case number, [id number], showed lymph nodes to be benign, without any tumor.  The skin showed benign skin with scar tissue, and no malignancy was identified at that time.  The patient healed very well and was referred to me for a further evaluation regarding his recent diagnosis of melanoma.  Clinically, he is asymptomatic.  He does not report any back pain.  He does not report any constitutional symptoms.  He is not reporting any weight loss or appetite changes.  He resumed his work-related duties rather quickly. He does report rather heavy sun exposure as a child.  He used to travel to his parent's [place] of [place]  He spent a lot of time in the sun, even as a young adult.  Did wear some sun protection, but had a lot of sunburns growing up.  REVIEW OF SYSTEMS:  Does not report any headaches, blurry vision, double vision.  Does not report any motor or sensory neuropathy.  Does not report any alteration in mental status.  Does  not report any psychiatric issues or depression.  Does not report any fever, chills, sweats.  Does not report any cough, hemoptysis, hematemesis.  No nausea, vomiting.  No abdominal pain.  No hematochezia, melena, or genitourinary complaints.  Rest of review of systems is unremarkable.  PAST MEDICAL HISTORY:  Really unremarkable.  He does not have a history of hypertension, diabetes, or coronary artery disease.  MEDICATIONS:  He was taking pain medication postoperatively, but takes none at this time.  ALLERGIES:  None.  SOCIAL HISTORY:  He is married.  He has 2 children.  He denies recent alcohol or tobacco abuse.  He used to drink and smoke heavily as a teenager, but stopped doing that.  FAMILY HISTORY:  His mother has hypertension.  His father has  prostate cancer as well as basal cell carcinoma.  Really no other family history.  PHYSICAL EXAMINATION:  General:  Alert, awake gentleman, appeared in no active distress.  Vital Signs:  His blood pressure is 132/86, pulse 95, respiration 18, weight is 186 pounds.  HEENT:  Head is normocephalic, atraumatic.  Pupils equal, round, reactive to light.  Oral mucosa moist and pink.  Neck:  Supple, without lymphadenopathy.  Heart:  Regular rate and rhythm.  S1, S2.  Lungs:  Clear to auscultation, without rhonchi, wheeze, or dullness to percussion.  Abdomen:  Soft, nontender.  No hepatosplenomegaly.  Skin:  Could not appreciate any skin rashes or lesions.  His scar on his back is well healed.  Lymphatic:  There is no evidence of any cervical, clavicular, or axillary lymphadenopathy detected.  LABORATORY DATA:  Showed a hemoglobin of 14.5, white cell count 4.5, platelet count 211,000.  ASSESSMENT AND PLAN:  A 47 year old gentleman with recent diagnosis of a melanoma.  He had presented with a lesion on the back, and the pathology confirmed the presence of superficial spreading Clark level III, depth of invasion of less than 1 mm, around 0.6.   He is status post wide excision, sentinel lymph node biopsy, indicating no involvement at this point.  I had a lengthy discussion today with James Burns discussing the natural course of melanoma as well as the treatment option.  At this point I felt his risk, due to the fact that he has fair skin, chronic sun exposure, caused him to do so.  No further treatment is really recommended in this particular adjuvant setting, essentially in an early stage IB disease.  There is really no indication for interferon or any immune therapy at this point.  I do believe that the only treatment at this point would be experimental as part of a clinical trial.  He, however, needs rather active surveillance.  I will do that checking his blood counts, including an LDH and liver function tests, and probably an annual x-ray.  Further imaging will be done as needed at this point.  I did recommend that he continue to follow up with Dr. Yetta Barre, dermatologist, on a regular basis as well.  I also discussed in the next part of our visit today the recommendation about sun protection, and to decrease his sun exposure, including high-SPF sunblock, as well as wearing the appropriate long-sleeve attire, as well as hat to protect his skin on his head, face, and neck, as well as his hands and legs. All his questions were answered today.    ______________________________ Benjiman Core, M.D. FNS/MEDQ  D:  01/23/2013  T:  01/24/2013  Job:  [id number]

## 2013-01-24 NOTE — Telephone Encounter (Signed)
Message copied by Reesa Chew on Thu Jan 24, 2013  2:13 PM ------      Message from: Benjiman Core      Created: Thu Jan 24, 2013 10:56 AM       Please call him with the results of chest xray. All normal. ------

## 2013-03-13 ENCOUNTER — Encounter (INDEPENDENT_AMBULATORY_CARE_PROVIDER_SITE_OTHER): Payer: Self-pay | Admitting: General Surgery

## 2013-03-26 ENCOUNTER — Encounter: Payer: Self-pay | Admitting: Oncology

## 2013-03-26 NOTE — Progress Notes (Signed)
Patient called and left message to call. He received bill for 70 copay which he thought he paid. I advised I see 10.00 due not 70.00, but will have it checked out for him and call him back.

## 2013-03-26 NOTE — Progress Notes (Signed)
Called patient back to advise 10.00 is his balance, not 70.00. See notes. Per the remittance on file, they did apply the higher copay to the pt in the amt of 70.00. I do see where we sent him the 70.00 bill on 03/06/2013, but then his 60.00 copay which was in his acct posted on 03/07/2013 and that is where we got the 10.00 balance. So the higher amt for (specialist) did apply.

## 2013-04-22 ENCOUNTER — Ambulatory Visit (INDEPENDENT_AMBULATORY_CARE_PROVIDER_SITE_OTHER): Payer: BC Managed Care – PPO | Admitting: General Surgery

## 2013-07-10 ENCOUNTER — Other Ambulatory Visit: Payer: Self-pay | Admitting: Dermatology

## 2013-07-26 ENCOUNTER — Telehealth: Payer: Self-pay | Admitting: *Deleted

## 2013-07-26 NOTE — Telephone Encounter (Signed)
Pt called to say he is cancelling his appointment with Dr Clelia Croft on 11/11 because he is being followed by his dermatologist and a Careers adviser. States his insurance will not cover all of these physicians

## 2013-07-30 ENCOUNTER — Ambulatory Visit: Payer: BC Managed Care – PPO | Admitting: Oncology

## 2013-07-30 ENCOUNTER — Other Ambulatory Visit: Payer: BC Managed Care – PPO | Admitting: Lab

## 2013-07-31 ENCOUNTER — Other Ambulatory Visit: Payer: Self-pay | Admitting: Dermatology

## 2014-06-29 IMAGING — CR DG CHEST 2V
2 series · 2 of 2 positions shown · non-contrast
Comparison: None.

CLINICAL DATA: Malignant melanoma.

CHEST - 2 VIEW

[w chest pa]
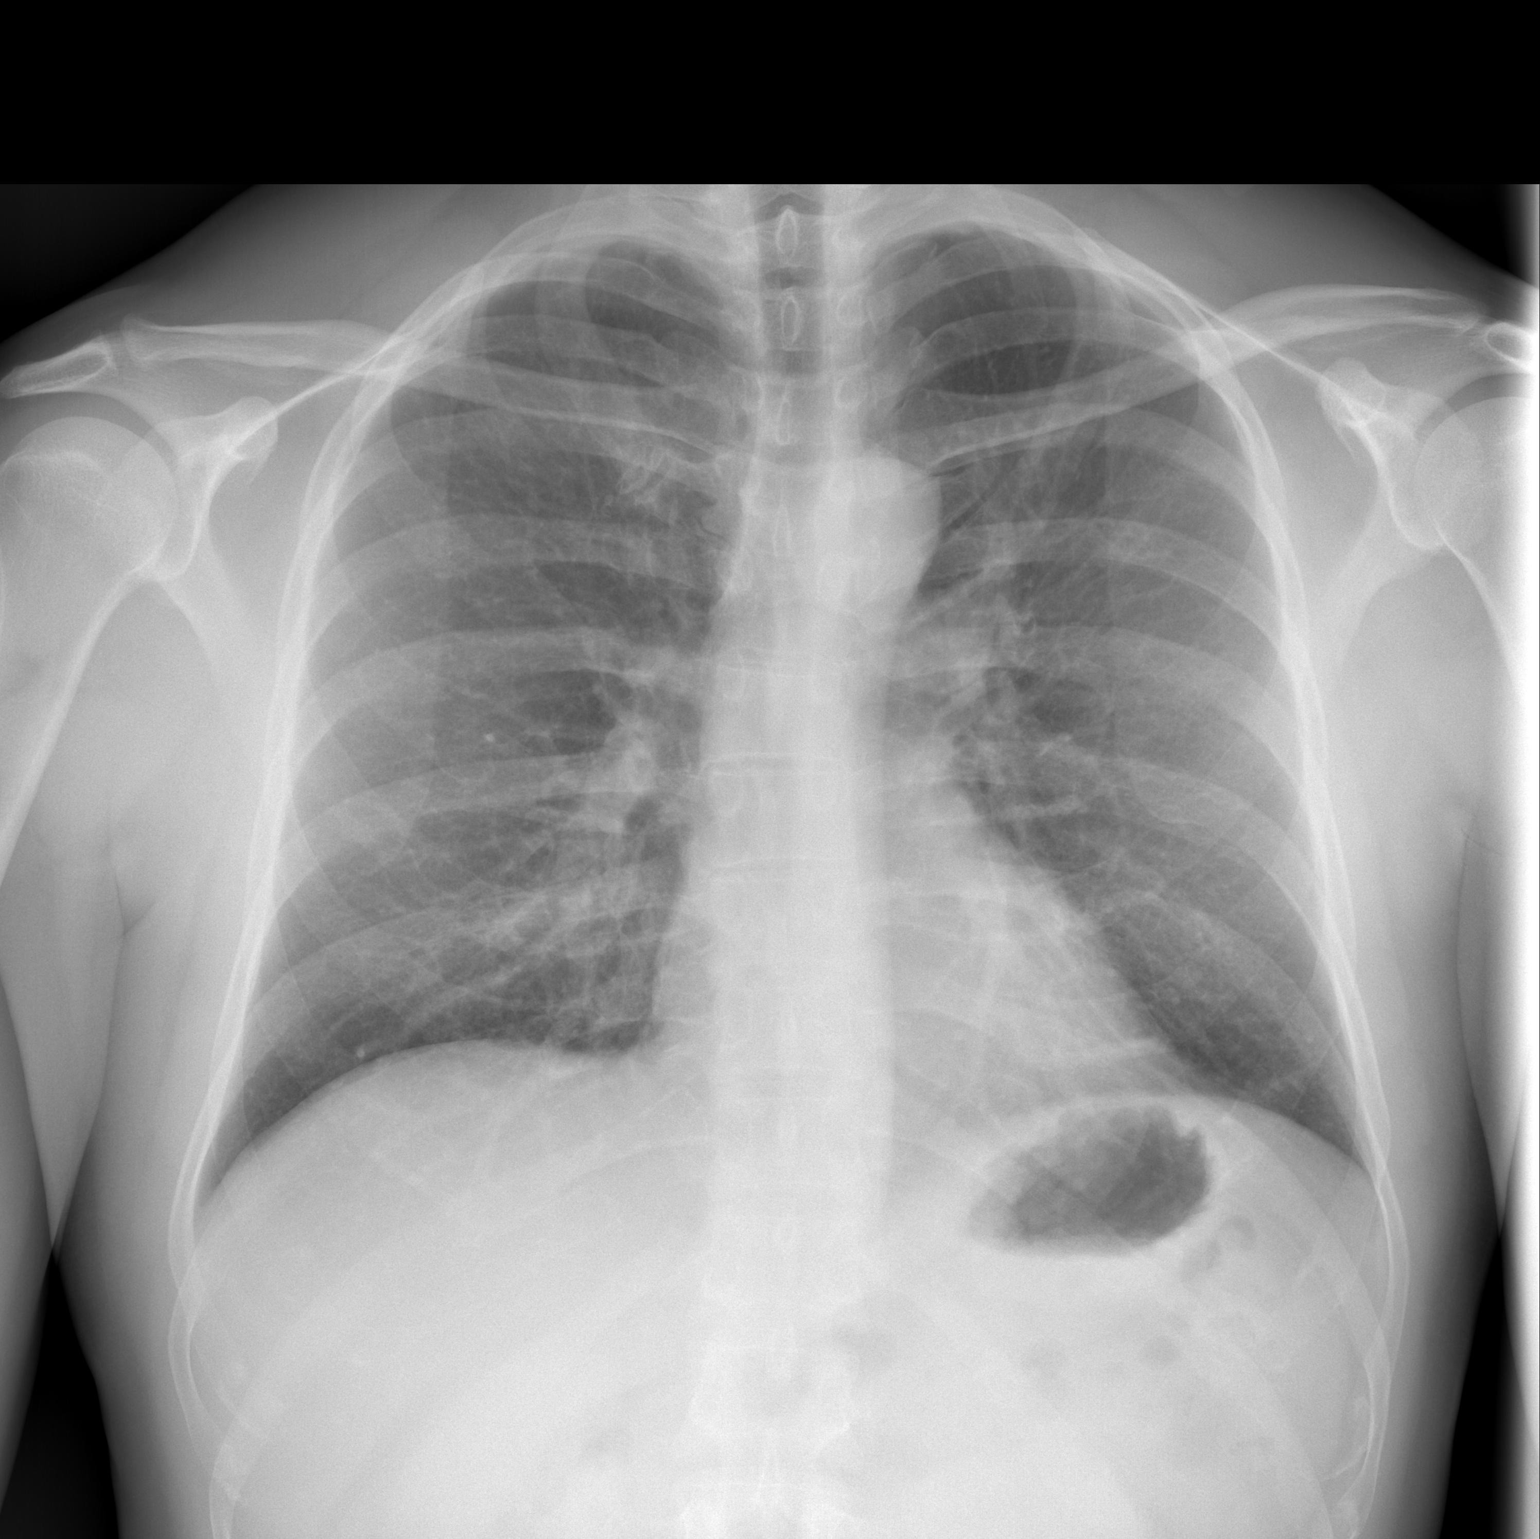

[w chest lat]
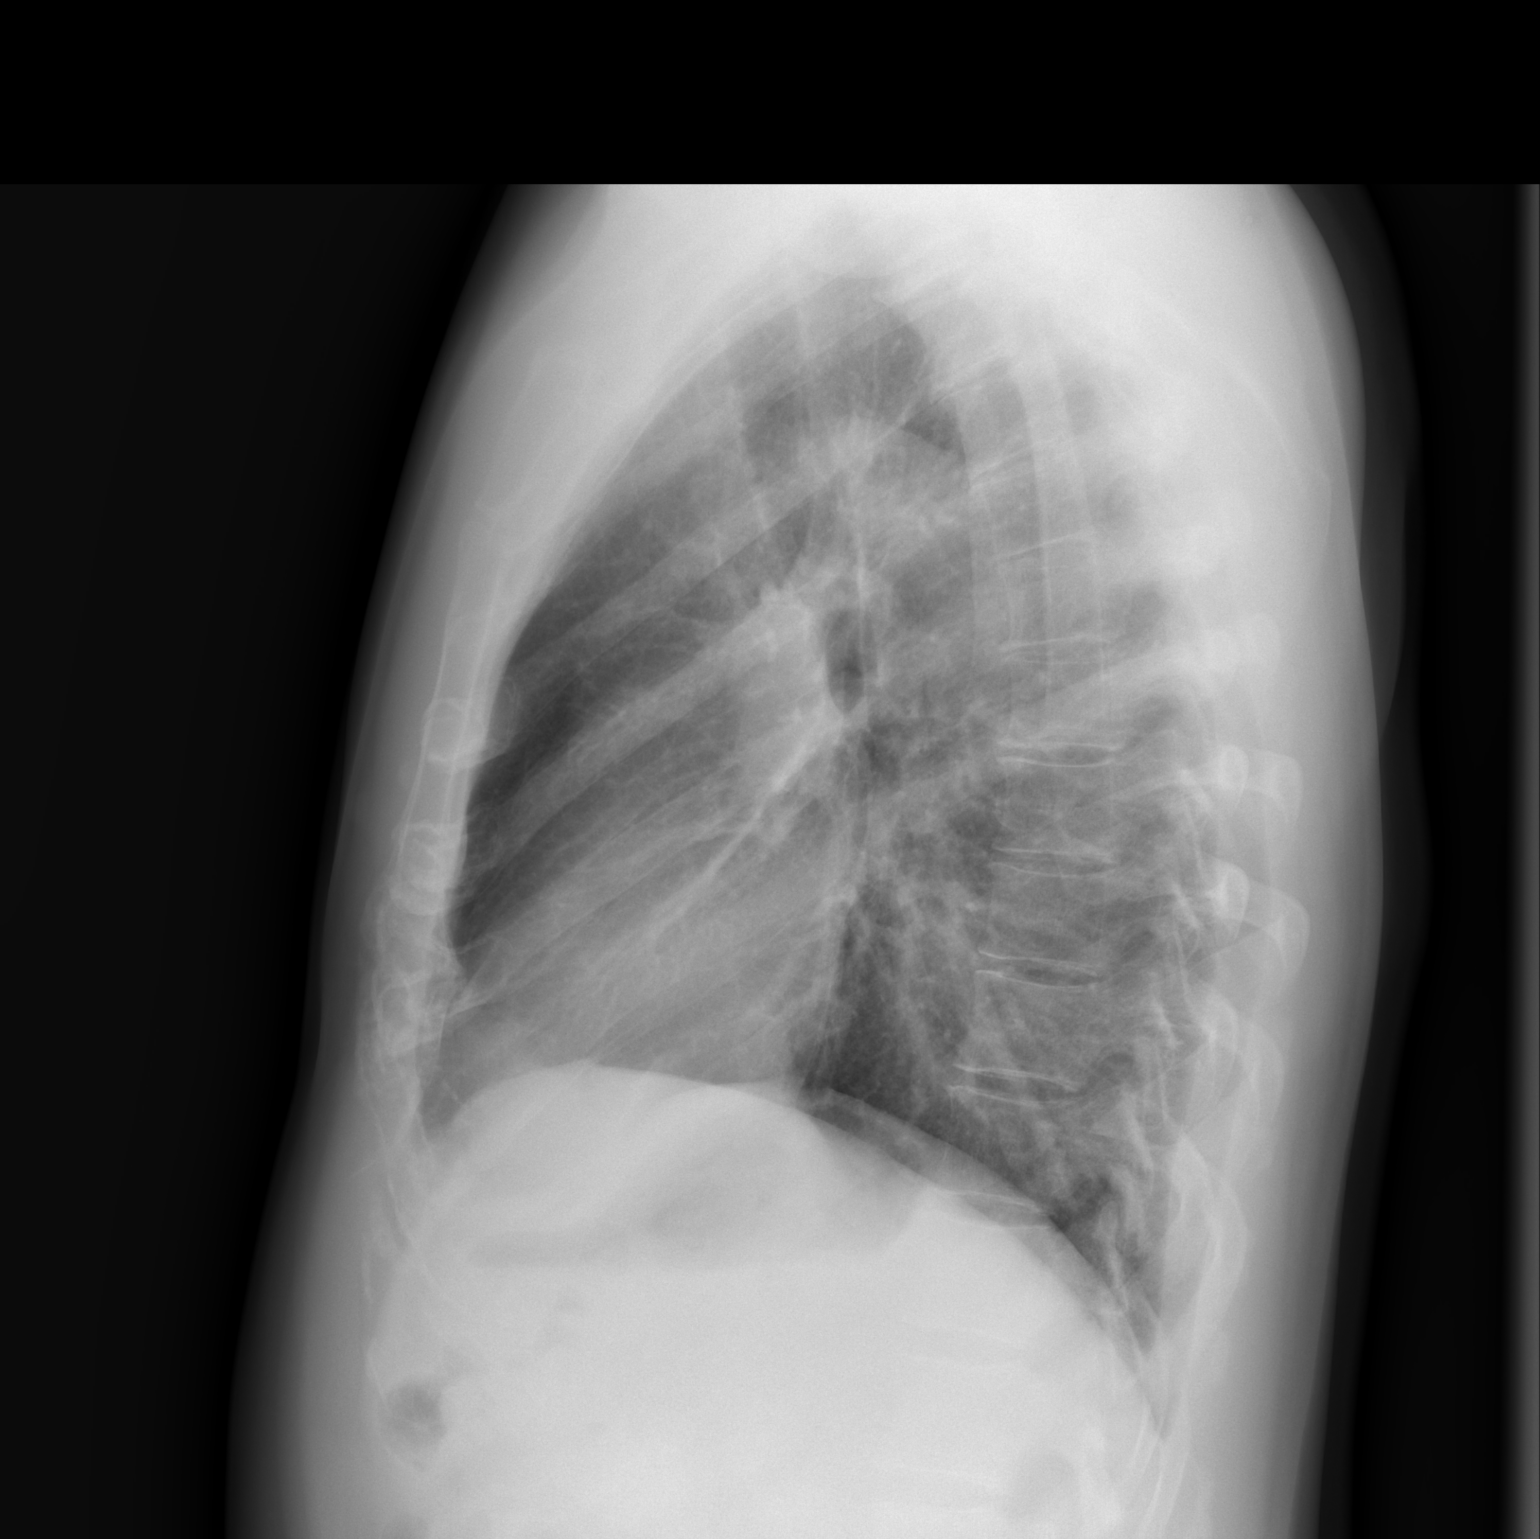

[2 of 2 positions shown; findings below may reference images not displayed]

FINDINGS: Heart size and pulmonary vascularity are normal.  Lungs
are free of infiltrate or effusion.  No mass or lung nodule is
present.
IMPRESSION: No active cardiopulmonary abnormality.

## 2019-07-01 ENCOUNTER — Other Ambulatory Visit: Payer: Self-pay | Admitting: *Deleted

## 2019-07-01 DIAGNOSIS — Z20822 Contact with and (suspected) exposure to covid-19: Secondary | ICD-10-CM

## 2019-07-02 LAB — NOVEL CORONAVIRUS, NAA: SARS-CoV-2, NAA: NOT DETECTED

## 2019-08-07 ENCOUNTER — Other Ambulatory Visit: Payer: Self-pay

## 2019-08-07 DIAGNOSIS — Z20822 Contact with and (suspected) exposure to covid-19: Secondary | ICD-10-CM

## 2019-08-09 LAB — NOVEL CORONAVIRUS, NAA: SARS-CoV-2, NAA: NOT DETECTED

## 2019-09-10 ENCOUNTER — Other Ambulatory Visit: Payer: BC Managed Care – PPO

## 2019-09-25 ENCOUNTER — Ambulatory Visit: Payer: HRSA Program | Attending: Internal Medicine

## 2019-09-25 DIAGNOSIS — Z20822 Contact with and (suspected) exposure to covid-19: Secondary | ICD-10-CM | POA: Diagnosis not present

## 2019-09-27 LAB — NOVEL CORONAVIRUS, NAA: SARS-CoV-2, NAA: NOT DETECTED

## 2020-08-10 ENCOUNTER — Other Ambulatory Visit: Payer: Self-pay

## 2022-05-16 ENCOUNTER — Encounter (HOSPITAL_BASED_OUTPATIENT_CLINIC_OR_DEPARTMENT_OTHER): Payer: Self-pay

## 2022-05-16 DIAGNOSIS — I2699 Other pulmonary embolism without acute cor pulmonale: Principal | ICD-10-CM | POA: Diagnosis present

## 2022-05-16 DIAGNOSIS — I82431 Acute embolism and thrombosis of right popliteal vein: Secondary | ICD-10-CM | POA: Diagnosis present

## 2022-05-16 DIAGNOSIS — D509 Iron deficiency anemia, unspecified: Secondary | ICD-10-CM | POA: Diagnosis present

## 2022-05-16 DIAGNOSIS — I2694 Multiple subsegmental pulmonary emboli without acute cor pulmonale: Secondary | ICD-10-CM | POA: Diagnosis present

## 2022-05-16 DIAGNOSIS — R030 Elevated blood-pressure reading, without diagnosis of hypertension: Secondary | ICD-10-CM | POA: Diagnosis present

## 2022-05-16 DIAGNOSIS — Z85820 Personal history of malignant melanoma of skin: Secondary | ICD-10-CM

## 2022-05-16 NOTE — ED Triage Notes (Signed)
Pt states that for the past 3-4 days he has been having pain and swelling to the R lower leg, no redness or warmth noted.

## 2022-05-17 ENCOUNTER — Emergency Department (HOSPITAL_BASED_OUTPATIENT_CLINIC_OR_DEPARTMENT_OTHER): Payer: Self-pay

## 2022-05-17 ENCOUNTER — Other Ambulatory Visit: Payer: Self-pay

## 2022-05-17 ENCOUNTER — Encounter (HOSPITAL_BASED_OUTPATIENT_CLINIC_OR_DEPARTMENT_OTHER): Payer: Self-pay | Admitting: Radiology

## 2022-05-17 ENCOUNTER — Inpatient Hospital Stay (HOSPITAL_BASED_OUTPATIENT_CLINIC_OR_DEPARTMENT_OTHER)
Admission: EM | Admit: 2022-05-17 | Discharge: 2022-05-18 | DRG: 176 | Disposition: A | Discharge status: Home / Self Care | Payer: Self-pay | Attending: Internal Medicine | Admitting: Internal Medicine

## 2022-05-17 ENCOUNTER — Encounter (HOSPITAL_COMMUNITY): Payer: Self-pay

## 2022-05-17 DIAGNOSIS — I2609 Other pulmonary embolism with acute cor pulmonale: Principal | ICD-10-CM

## 2022-05-17 DIAGNOSIS — I82431 Acute embolism and thrombosis of right popliteal vein: Secondary | ICD-10-CM

## 2022-05-17 DIAGNOSIS — I2699 Other pulmonary embolism without acute cor pulmonale: Secondary | ICD-10-CM | POA: Diagnosis present

## 2022-05-17 DIAGNOSIS — I82409 Acute embolism and thrombosis of unspecified deep veins of unspecified lower extremity: Secondary | ICD-10-CM | POA: Diagnosis present

## 2022-05-17 LAB — HEPARIN LEVEL (UNFRACTIONATED)
Heparin Unfractionated: 0.65 IU/mL (ref 0.30–0.70)
Heparin Unfractionated: 0.46 IU/mL (ref 0.30–0.70)

## 2022-05-17 LAB — CBC WITH DIFFERENTIAL/PLATELET
Abs Immature Granulocytes: 0.02 10*3/uL (ref 0.00–0.07)
Basophils Absolute: 0 10*3/uL (ref 0.0–0.1)
Basophils Relative: 1 %
Eosinophils Absolute: 0.2 10*3/uL (ref 0.0–0.5)
Eosinophils Relative: 2 %
HCT: 37.2 % — ABNORMAL LOW (ref 39.0–52.0)
Hemoglobin: 12.4 g/dL — ABNORMAL LOW (ref 13.0–17.0)
Immature Granulocytes: 0 %
Lymphocytes Relative: 25 %
Lymphs Abs: 1.7 10*3/uL (ref 0.7–4.0)
MCH: 25.8 pg — ABNORMAL LOW (ref 26.0–34.0)
MCHC: 33.3 g/dL (ref 30.0–36.0)
MCV: 77.5 fL — ABNORMAL LOW (ref 80.0–100.0)
Monocytes Absolute: 1 10*3/uL (ref 0.1–1.0)
Monocytes Relative: 14 %
Neutro Abs: 4 10*3/uL (ref 1.7–7.7)
Neutrophils Relative %: 58 %
Platelets: 240 10*3/uL (ref 150–400)
RBC: 4.8 MIL/uL (ref 4.22–5.81)
RDW: 16.4 % — ABNORMAL HIGH (ref 11.5–15.5)
WBC: 6.9 10*3/uL (ref 4.0–10.5)
nRBC: 0 % (ref 0.0–0.2)

## 2022-05-17 LAB — BASIC METABOLIC PANEL
Anion gap: 7 (ref 5–15)
BUN: 25 mg/dL — ABNORMAL HIGH (ref 6–20)
CO2: 26 mmol/L (ref 22–32)
Calcium: 9.5 mg/dL (ref 8.9–10.3)
Chloride: 105 mmol/L (ref 98–111)
Creatinine, Ser: 1.13 mg/dL (ref 0.61–1.24)
GFR, Estimated: >60 mL/min (ref >60)
Glucose, Bld: 111 mg/dL — ABNORMAL HIGH (ref 70–99)
Potassium: 4.1 mmol/L (ref 3.5–5.1)
Sodium: 138 mmol/L (ref 135–145)

## 2022-05-17 LAB — D-DIMER, QUANTITATIVE: D-Dimer, Quant: 6.7 ug/mL-FEU — ABNORMAL HIGH (ref 0.00–0.50)

## 2022-05-17 LAB — TROPONIN I (HIGH SENSITIVITY)
Troponin I (High Sensitivity): 3 ng/L (ref <18)
Troponin I (High Sensitivity): 3 ng/L (ref <18)

## 2022-05-17 LAB — BRAIN NATRIURETIC PEPTIDE: B Natriuretic Peptide: 9.9 pg/mL (ref 0.0–100.0)

## 2022-05-17 MED ORDER — HEPARIN (PORCINE) 25000 UT/250ML-% IV SOLN
1400.0000 [IU]/h | INTRAVENOUS | Status: AC
  Administered 2022-05-17 – 2022-05-18 (×3): 1400 [IU]/h via INTRAVENOUS
  Filled 2022-05-17 (×3): qty 250

## 2022-05-17 MED ORDER — ALBUTEROL SULFATE (2.5 MG/3ML) 0.083% IN NEBU: 2.5000 mg | INHALATION_SOLUTION | RESPIRATORY_TRACT | Status: DC | PRN

## 2022-05-17 MED ORDER — ACETAMINOPHEN 325 MG PO TABS: 650.0000 mg | ORAL_TABLET | Freq: Four times a day (QID) | ORAL | Status: DC | PRN

## 2022-05-17 MED ORDER — SODIUM CHLORIDE 0.9% FLUSH
3.0000 mL | Freq: Two times a day (BID) | INTRAVENOUS | Status: DC
  Administered 2022-05-17: 3 mL via INTRAVENOUS

## 2022-05-17 MED ORDER — ACETAMINOPHEN 650 MG RE SUPP: 650.0000 mg | Freq: Four times a day (QID) | RECTAL | Status: DC | PRN

## 2022-05-17 MED ORDER — IOHEXOL 350 MG/ML SOLN
100.0000 mL | Freq: Once | INTRAVENOUS | Status: AC | PRN
  Administered 2022-05-17: 75 mL via INTRAVENOUS

## 2022-05-17 MED ORDER — HEPARIN BOLUS VIA INFUSION
6000.0000 [IU] | Freq: Once | INTRAVENOUS | Status: AC
  Administered 2022-05-17: 6000 [IU] via INTRAVENOUS

## 2022-05-17 NOTE — ED Notes (Signed)
Provided pt with oatmeal, coffee

## 2022-05-17 NOTE — H&P (Signed)
History and Physical    James Burns GBT:[id number] DOB: [birth year] DOA: 05/17/2022  PCP: James Pepper, MD   I have briefly reviewed patients previous medical reports in [facility].  Patient coming from: Home  Chief Complaint: Right calf pain and swelling x4 days and dyspnea on exertion x2 days.  HPI: James Burns is a 56 year old married male, works in [employer], physically very active (rides bike 30 to 40 miles per week, push-ups etc.), PMH of malignant melanoma excision from right upper back 2015 and in remission, follows up annually with dermatology with last visit approximately 3 months ago, no other significant medical history, scented to the [facility] on 05/16/2022 with complaints of right calf pain and swelling of 4 days duration and dyspnea on exertion of 2 days duration.  Patient was in his usual state of health and first noticed cramping of his right calf when he was ready to ride his bike on 05/14/2022.  He had a flat tire and decided not to go biking.  Over the weekend, he noticed progressive swelling and pain of his right calf and decided to ice his calf.  On 05/16/2022, while at work, when he climbed a flight of stairs he noticed dyspnea on exertion and palpitations which was very unusual for him.  This was not associated with chest pain, dizziness, lightheadedness or passing out.  He also noted progressive swelling, stiffness and pain up to 7/10 of his right calf.  He discussed with his RN nurse who advised ED visit.    He reports a 10-hour car drive to [place] in early August with a 15-minute break in between.  He denied any asymmetric leg pain or swelling or dyspnea at that time.  He returned to [facility] by flight.  He says he does about 4-5 trips to [place] every year and this is not unusual for him.  Today he also found out that his father was approximately 49 years of age had history of lower extremity DVT and PE for which she was on  anticoagulation for 6 months and currently on aspirin alone.  Patient denies prior personal history of VTE, other cancers other than the malignant melanoma or bleeding.  No other family members with history of VTE.  ED Course: Mildly hypertensive with blood pressures as high as 148/101 but otherwise vital signs are stable and no hypoxia.  Review of Systems:  All other systems reviewed and apart from HPI, are negative.  Past Medical History:  Diagnosis Date   Cancer ([place])    Melanoma   Medical history non-contributory    Wears glasses     Past Surgical History:  Procedure Laterality Date   MELANOMA EXCISION WITH SENTINEL LYMPH NODE BIOPSY Right 11/30/2012   Procedure: MELANOMA wide EXCISION right back  WITH SENTINEL LYMPH NODE BIOPSY nuclear medicine injection 7:00;  Surgeon: James Jarred, MD;  Location: [facility];  Service: General;  Laterality: Right;  melanoma nuclear medicine injection at 7:00 per James Burns    Rotator cuff surgery Bilateral    SIGMOIDOSCOPY     TONSILLECTOMY     WISDOM TOOTH EXTRACTION      Social History  reports that he has never smoked. He does not have any smokeless tobacco history on file. He reports that he does not drink alcohol and does not use drugs. He says he may occasionally have a beer but no regular alcohol use.  No Known Allergies  History reviewed.  Family history as  noted above.   Prior to Admission medications   Not on File  Not on any prescription meds.  Physical Exam: Vitals:   05/17/22 1400 05/17/22 1430 05/17/22 1500 05/17/22 1658  BP: (!) 136/96 (!) 128/90 120/88 (!) 130/96  Pulse: 79 76 76 79  Resp: (!) '23 20 15 18  '$ Temp:    98.6 F (37 C)  TempSrc:    Oral  SpO2: 98% 95% 96% 99%  Weight:    82 kg  Height:    6' (1.829 m)      Constitutional: Young male, well-built and nourished, lying comfortably supine in bed without distress.  Oral mucosa moist. Eyes: PERTLA, lids and conjunctivae normal.??  Immature  bilateral cataracts. ENMT: Mucous membranes are moist. Posterior pharynx clear of any exudate or lesions. Normal dentition.  Neck: supple, no masses, no thyromegaly Respiratory: Clear to auscultation without wheezing, rhonchi or crackles. No increased work of breathing. Cardiovascular: S1 & S2 heard, regular rate and rhythm. No JVD, murmurs, rubs or clicks. No pedal edema.  Telemetry personally reviewed: Sinus rhythm. Abdomen: Non distended. Non tender. Soft. No organomegaly or masses appreciated. No clinical Ascites. Normal bowel sounds heard. Musculoskeletal: no clubbing / cyanosis. No joint deformity upper and lower extremities. Good ROM, no contractures. Normal muscle tone.  Although both legs look symmetric, right cough clearly is firm to touch compared to the left.  Compartments are soft and distal pulses are symmetrically well felt. Skin: Patient has 2 surgical scars on the right upper back from prior malignant melanoma surgery.  He also has a mid back hyperpigmented lesion which she says is being closely monitored by his dermatologist. Neurologic: CN 2-12 grossly intact. Sensation intact, DTR normal. Strength 5/5 in all 4 limbs.  Psychiatric: Normal judgment and insight. Alert and oriented x 3. Normal mood.     Labs on Admission: I have personally reviewed following labs and imaging studies  CBC: Recent Labs  Lab 05/17/22 0301  WBC 6.9  NEUTROABS 4.0  HGB 12.4*  HCT 37.2*  MCV 77.5*  PLT 924    Basic Metabolic Panel: Recent Labs  Lab 05/17/22 0301  NA 138  K 4.1  CL 105  CO2 26  GLUCOSE 111*  BUN 25*  CREATININE 1.13  CALCIUM 9.5      Radiological Exams on Admission: CT Angio Chest PE W and/or Wo Contrast  Result Date: 05/17/2022 CLINICAL DATA:  Pulmonary embolism suspected, high probability. EXAM: CT ANGIOGRAPHY CHEST WITH CONTRAST TECHNIQUE: Multidetector CT imaging of the chest was performed using the standard protocol during bolus administration of  intravenous contrast. Multiplanar CT image reconstructions and MIPs were obtained to evaluate the vascular anatomy. RADIATION DOSE REDUCTION: This exam was performed according to the departmental dose-optimization program which includes automated exposure control, adjustment of the mA and/or kV according to patient size and/or use of iterative reconstruction technique. CONTRAST:  62m OMNIPAQUE IOHEXOL 350 MG/ML SOLN COMPARISON:  None Available. FINDINGS: Cardiovascular: Branching pulmonary emboli in the bilateral lungs affecting right lobar and diffuse segmental branches. All lobes are affected. Heart size is normal but the RV to LV ratio is abnormal at 1.2. Negative aorta. Mild coronary calcification at the LAD. Mediastinum/Nodes: No adenopathy or mass Lungs/Pleura: No pulmonary edema or pulmonary infarct. Upper Abdomen: Negative Musculoskeletal: Negative Review of the MIP images confirms the above findings. Critical Value/emergent results were called by telephone at the time of interpretation on 05/17/2022 at 4:22 am to provider DVeryl Speak, who verbally acknowledged these results. IMPRESSION:  Positive for acute lobar and segmental pulmonary embolism with CT evidence of right heart strain (RV/LV Ratio = 1.2) consistent with at least submassive (intermediate risk) PE. The presence of right heart strain has been associated with an increased risk of morbidity and mortality. Please refer to the "Code PE Focused" order set in EPIC. Electronically Signed   By: Jorje Guild M.D.   On: 05/17/2022 04:25   US Venous Img Lower Unilateral Right (DVT)  Result Date: 05/17/2022 CLINICAL DATA:  Right calf pain and swelling. EXAM: RIGHT LOWER EXTREMITY VENOUS DOPPLER ULTRASOUND TECHNIQUE: Gray-scale sonography with graded compression, as well as color Doppler and duplex ultrasound were performed to evaluate the lower extremity deep venous systems from the level of the common femoral vein and including the common femoral,  femoral, profunda femoral, popliteal and calf veins including the posterior tibial, peroneal and gastrocnemius veins when visible. The superficial great saphenous vein was also interrogated. Spectral Doppler was utilized to evaluate flow at rest and with distal augmentation maneuvers in the common femoral, femoral and popliteal veins. COMPARISON:  None Available. FINDINGS: Contralateral Common Femoral Vein: Respiratory phasicity is normal and symmetric with the symptomatic side. No evidence of thrombus. Normal compressibility. Common Femoral Vein: No evidence of thrombus. Normal compressibility, respiratory phasicity and response to augmentation. Saphenofemoral Junction: No evidence of thrombus. Normal compressibility and flow on color Doppler imaging. Profunda Femoral Vein: No evidence of thrombus. Normal compressibility and flow on color Doppler imaging. Femoral Vein: No evidence of thrombus. Normal compressibility, respiratory phasicity and response to augmentation. Popliteal Vein: Evidence of occlusive thrombus with abnormal compressibility, respiratory phasicity and response to augmentation. Calf Veins: Evidence of occlusive thrombus with abnormal compressibility and flow on color Doppler imaging. Superficial Great Saphenous Vein: No evidence of thrombus. Normal compressibility. Venous Reflux:  None. Other Findings:  None. IMPRESSION: Findings consistent with occlusive DVT within the RIGHT popliteal, RIGHT posterior tibial and RIGHT peroneal veins. Electronically Signed   By: Virgina Norfolk M.D.   On: 05/17/2022 02:22    EKG: Independently reviewed. None seen in [facility], ordered.  Assessment/Plan Principal Problem:   Acute pulmonary embolism (HCC) Active Problems:   DVT (deep venous thrombosis) (HCC)     Acute massive pulmonary embolism and acute RLE DVT (acute VTE) - CTA chest 8/29: Acute lobar and segmental pulmonary embolism with CT evidence of right heart strain. - RLE venous Doppler showed DVT  in the right popliteal, right posterior tibial and right peroneal veins. - Etiology: Do not know if you would call this provoked VTE (if you consider the 10-hour almost uninterrupted car ride to [place] in early August 2023) versus unprovoked (has positive family history).  This obviously will determine the duration of anticoagulation. - Hemodynamically stable and not hypoxic.  HS Troponin's x2 negative (3 > 3).  BNP 9.9.  D-dimer 6.70. - Continue current IV heparin drip per pharmacy pending 2D echocardiogram in a.m. - If remains stable and echo without abnormalities, can be transitioned tomorrow to oral anticoagulation.  Will have to have discussion with patient regarding available options, risk versus benefits etc.  His spouse who is an RN may be able to assist in this. - Recommend outpatient hematology consultation to evaluate for prothrombotic etiologies. - Will get an EKG, none seen in [facility]  Elevated blood pressure: No prior history of hypertension.  Could be whitecoat hypertension.  Monitor BP closely.  Could consider as needed IV hydralazine if BP consistently greater than 170/100.  Otherwise outpatient follow-up.  Microcytic anemia:  Patient reports that he had a screening colonoscopy by Dr. Wilford Corner, [facility] approximately a year ago and it was reported to be normal.  History of malignant melanoma: S/p excision in 2015.  Annually follows up with Dr. Ronnald Ramp, Dermatology and is in remission.   DVT prophylaxis: Currently on IV heparin anticoagulation Code Status: Full code, confirmed with patient. Family Communication: Discussed with patient on his video phone, updated care and answered all questions. Disposition Plan:   Patient is from:  Home  Anticipated DC to:  Home  Anticipated DC date:  05/18/2022  Anticipated DC barriers: None   Consults called: None Admission status: Inpatient, progressive bed  Severity of Illness: The appropriate patient status for this patient is  INPATIENT. Inpatient status is judged to be reasonable and necessary in order to provide the required intensity of service to ensure the patient's safety. The patient's presenting symptoms, physical exam findings, and initial radiographic and laboratory data in the context of their chronic comorbidities is felt to place them at high risk for further clinical deterioration. Furthermore, it is not anticipated that the patient will be medically stable for discharge from the hospital within 2 midnights of admission.   * I certify that at the point of admission it is my clinical judgment that the patient will require inpatient hospital care spanning beyond 2 midnights from the point of admission due to high intensity of service, high risk for further deterioration and high frequency of surveillance required.Vernell Leep MD [facility]  To contact the attending provider between 7A-7P or the covering provider during after hours 7P-7A, please log into the web site [email or web address] and access using universal [place] password for that web site. If you do not have the password, please call the hospital operator.  05/17/2022, 5:50 PM

## 2022-05-17 NOTE — Progress Notes (Signed)
[facility] for Heparin  Indication: pulmonary embolus/DVT  No Known Allergies  Patient Measurements: Height: 6' (182.9 cm) Weight: 82.6 kg (182 lb) IBW/kg (Calculated) : 77.6  Vital Signs: Temp: 98.1 F (36.7 C) (08/29 1145) Temp Source: Oral (08/29 1145) BP: 136/96 (08/29 1400) Pulse Rate: 79 (08/29 1400)  Labs: Recent Labs    05/17/22 0301 05/17/22 0800 05/17/22 1319  HGB 12.4*  --   --   HCT 37.2*  --   --   PLT 240  --   --   HEPARINUNFRC  --   --  0.65  CREATININE 1.13  --   --   TROPONINIHS 3 3  --      Estimated Creatinine Clearance: 81.1 mL/min (by C-G formula based on SCr of 1.13 mg/dL).  Assessment: 56 y/o with several days of calf pain. Found to have new onset DVT and PE and started on IV heparin. Initial level is therapeutic. No bleeding noted.   Goal of Therapy:  Heparin level 0.3-0.7 units/ml Monitor platelets by anticoagulation protocol: Yes   Plan:  Continue heparin gtt 1400 units/hr Recheck a heparin level tonight Daily heparin level and CBC  Salome Arnt, PharmD, BCPS, BCEMP Clinical Pharmacist Please see AMION for all pharmacy numbers 05/17/2022 2:36 PM

## 2022-05-17 NOTE — Progress Notes (Signed)
ANTICOAGULATION CONSULT NOTE - Follow Up Consult  Pharmacy Consult for Heparin Indication: DVT  No Known Allergies  Patient Measurements: Height: 6' (182.9 cm) Weight: 82 kg (180 lb 12.4 oz) IBW/kg (Calculated) : 77.6 Heparin Dosing Weight: TBW  Vital Signs: Temp: 98.6 F (37 C) (08/29 1658) Temp Source: Oral (08/29 1658) BP: 130/96 (08/29 1658) Pulse Rate: 79 (08/29 1658)  Labs: Recent Labs    05/17/22 0301 05/17/22 0800 05/17/22 1319  HGB 12.4*  --   --   HCT 37.2*  --   --   PLT 240  --   --   HEPARINUNFRC  --   --  0.65  CREATININE 1.13  --   --   TROPONINIHS 3 3  --     Estimated Creatinine Clearance: 81.1 mL/min (by C-G formula based on SCr of 1.13 mg/dL).   Medications:  Infusions:   heparin 1,400 Units/hr (05/17/22 1753)    Assessment: 81 yoM presented to ED on 8/29 with several days of calf pain/swelling, and DOE.  Found to have new onset DVT and PE and started on IV heparin per pharmacy dosing.    Heparin level 0.46, remains therapeutic on heparin 1400 units/hr. CBC:  Hgb 12.4, Plt WNL No bleeding or complications reported.  Goal of Therapy:  Heparin level 0.3-0.7 units/ml Monitor platelets by anticoagulation protocol: Yes   Plan:  Continue heparin IV infusion at 1400 units/hr Daily heparin level and CBC   Gretta Arab PharmD, BCPS Clinical Pharmacist [facility] main pharmacy [phone] 05/17/2022 8:22 PM

## 2022-05-17 NOTE — Progress Notes (Signed)
Plan of Care Note for accepted transfer   Patient: James Burns MRN: [id number]   [place]: 05/17/2022  Facility requesting transfer: [facility] Requesting Provider: Dr. Stark Jock Reason for transfer: DVT with submassive pulmonary embolism and right heart strain Facility course:   56 year old male with no significant past medical history presents to [facility] emergency department with complaints of pain in left calf last few days as well as dyspnea on exertion.   The dyspnea on exertion is quite a departure from the patient's usual symptoms as he is extremely physically active and regularly cycling.   Work-up at [facility] initially revealed findings consistent with occlusive right DVT within the right popliteal right posterior tibial and right peroneal veins.  With a substantial clot burden and complaints of dyspnea on exertion EDP additionally ordered CT angiogram of the chest revealing acute lobar and segmental pulmonary embolism with CT evidence of right heart strain consistent with submassive pulmonary embolism.  Reassuringly patient is not requiring oxygen and blood pressures are stable.  I we will tentatively accept the patient to a progressive bed at [facility] long however I advised obtaining a troponin and BNP and if these are elevated EDP will discuss case with PCCM to reevaluate whether patient would best be served by going to the ICU for potential thrombectomy or thrombolysis.  Plan of care: The patient is accepted for admission to Progressive unit, at [facility]..    Author: Vernelle Emerald, MD 05/17/2022  Check [email or web address] for on-call coverage.  Nursing staff, Please call [place] number on Amion as soon as patient's arrival, so appropriate admitting provider can evaluate the pt.

## 2022-05-17 NOTE — ED Provider Notes (Signed)
  [facility] EMERGENCY DEPT Provider Note   CSN: [id number] Arrival date & time: 05/16/22  1938     History  Chief Complaint  Patient presents with   Leg Swelling    James Burns is a 56 y.o. male.  Patient is a 56 year old male with no significant past medical history.  Patient presenting today with complaints of right calf pain.  This has been bothering him for the past several days.  This began in the absence of any injury or trauma.  Pain is worse when he ambulates or compresses the area.  He denies any chest pain or difficulty breathing.  He has no prior history of blood clots.    The history is provided by the patient.       Home Medications Prior to Admission medications   Medication Sig Start Date End Date Taking? Authorizing Provider  Multiple Vitamins-Minerals (MULTIVITAMIN WITH MINERALS) tablet Take 1 tablet by mouth daily.    [provider]      Allergies    Patient has no known allergies.    Review of Systems   Review of Systems  All other systems reviewed and are negative.   Physical Exam Updated Vital Signs BP (!) 148/101 (BP Location: Right Arm)   Pulse 80   Temp 98.3 F (36.8 C)   Resp 18   Ht 6' (1.829 m)   Wt 82.6 kg   SpO2 100%   BMI 24.68 kg/m  Physical Exam Vitals and nursing note reviewed.  Constitutional:      General: He is not in acute distress.    Appearance: Normal appearance. He is not ill-appearing.  HENT:     Head: Normocephalic and atraumatic.  Pulmonary:     Effort: Pulmonary effort is normal.  Musculoskeletal:     Comments: The right calf is grossly normal in appearance.  There is tenderness over the inferior aspect of the calf.  DP pulses are easily palpable and motor and sensation are intact throughout the entire foot.  There is no significant edema or erythema.  Skin:    General: Skin is warm and dry.  Neurological:     Mental Status: He is alert.     ED Results / Procedures / Treatments    Labs (all labs ordered are listed, but only abnormal results are displayed) Labs Reviewed - No data to display  EKG None  Radiology No results found.  Procedures Procedures  {Document cardiac monitor, telemetry assessment procedure when appropriate:1}  Medications Ordered in ED Medications - No data to display  ED Course/ Medical Decision Making/ A&P                           Medical Decision Making  ***  {Document critical care time when appropriate:1} {Document review of labs and clinical decision tools ie heart score, Chads2Vasc2 etc:1}  {Document your independent review of radiology images, and any outside records:1} {Document your discussion with family members, caretakers, and with consultants:1} {Document social determinants of health affecting pt's care:1} {Document your decision making why or why not admission, treatments were needed:1} Final Clinical Impression(s) / ED Diagnoses Final diagnoses:  None    Rx / DC Orders ED Discharge Orders     None

## 2022-05-17 NOTE — Progress Notes (Signed)
ANTICOAGULATION CONSULT NOTE - Initial Consult  Pharmacy Consult for Heparin  Indication: pulmonary embolus/DVT  No Known Allergies  Patient Measurements: Height: 6' (182.9 cm) Weight: 82.6 kg (182 lb) IBW/kg (Calculated) : 77.6  Vital Signs: Temp: 98.3 F (36.8 C) (08/28 2010) BP: 136/98 (08/29 0345) Pulse Rate: 66 (08/29 0400)  Labs: Recent Labs    05/17/22 0301  HGB 12.4*  HCT 37.2*  PLT 240  CREATININE 1.13  TROPONINIHS 3    Estimated Creatinine Clearance: 81.1 mL/min (by C-G formula based on SCr of 1.13 mg/dL).   Medical History: Past Medical History:  Diagnosis Date   Cancer ([place])    Melanoma   Medical history non-contributory    Wears glasses     Assessment: 56 y/o with several days of calf pain. Found to have new onset DVT and PE. Starting heparin. Labs above reviewed. PTA meds reviewed.   Goal of Therapy:  Heparin level 0.3-0.7 units/ml Monitor platelets by anticoagulation protocol: Yes   Plan:  Heparin 6000 units BOLUS Start heparin drip at 1400 units/hr 1300 Heparin level Daily CBC/Heparin level Monitor for bleeding  Narda Bonds, PharmD, BCPS Clinical Pharmacist Phone: [phone]

## 2022-05-17 NOTE — ED Notes (Signed)
Offered pt lunch, stated his wife is bringing him food. Informed pt no update on room.

## 2022-05-18 ENCOUNTER — Other Ambulatory Visit (HOSPITAL_COMMUNITY): Payer: Self-pay

## 2022-05-18 ENCOUNTER — Inpatient Hospital Stay (HOSPITAL_COMMUNITY): Payer: Self-pay

## 2022-05-18 ENCOUNTER — Telehealth (HOSPITAL_COMMUNITY): Payer: Self-pay | Admitting: Pharmacy Technician

## 2022-05-18 DIAGNOSIS — I2609 Other pulmonary embolism with acute cor pulmonale: Secondary | ICD-10-CM

## 2022-05-18 LAB — ECHOCARDIOGRAM COMPLETE
Area-P 1/2: 2.79 cm2
Height: 72 in
S' Lateral: 2.7 cm
Weight: 2892.44 oz

## 2022-05-18 LAB — CBC
HCT: 41 % (ref 39.0–52.0)
Hemoglobin: 13 g/dL (ref 13.0–17.0)
MCH: 25.8 pg — ABNORMAL LOW (ref 26.0–34.0)
MCHC: 31.7 g/dL (ref 30.0–36.0)
MCV: 81.3 fL (ref 80.0–100.0)
Platelets: 249 10*3/uL (ref 150–400)
RBC: 5.04 MIL/uL (ref 4.22–5.81)
RDW: 16.8 % — ABNORMAL HIGH (ref 11.5–15.5)
WBC: 5.8 10*3/uL (ref 4.0–10.5)
nRBC: 0 % (ref 0.0–0.2)

## 2022-05-18 LAB — HEPARIN LEVEL (UNFRACTIONATED): Heparin Unfractionated: 0.61 IU/mL (ref 0.30–0.70)

## 2022-05-18 LAB — HIV ANTIBODY (ROUTINE TESTING W REFLEX): HIV Screen 4th Generation wRfx: NONREACTIVE

## 2022-05-18 MED ORDER — APIXABAN 5 MG PO TABS: 5.0000 mg | ORAL_TABLET | Freq: Two times a day (BID) | ORAL | Status: DC

## 2022-05-18 MED ORDER — APIXABAN (ELIQUIS) EDUCATION KIT FOR DVT/PE PATIENTS: PACK | Freq: Once | Status: DC

## 2022-05-18 MED ORDER — APIXABAN 5 MG PO TABS
10.0000 mg | ORAL_TABLET | Freq: Two times a day (BID) | ORAL | Status: DC
Start: 2022-05-18 — End: 2022-05-18
  Administered 2022-05-18: 10 mg via ORAL
  Filled 2022-05-18: qty 2

## 2022-05-18 MED ORDER — APIXABAN (ELIQUIS) VTE STARTER PACK (10MG AND 5MG)
ORAL_TABLET | ORAL | 0 refills | Status: DC
  Filled 2022-05-18: qty 74, 28d supply, fill #0

## 2022-05-18 NOTE — Progress Notes (Addendum)
ANTICOAGULATION CONSULT NOTE - Follow Up Consult  Pharmacy Consult for Heparin Indication: DVT  No Known Allergies  Patient Measurements: Height: 6' (182.9 cm) Weight: 82 kg (180 lb 12.4 oz) IBW/kg (Calculated) : 77.6 Heparin Dosing Weight: TBW  Vital Signs: Temp: 98.2 F (36.8 C) (08/30 0436) Temp Source: Oral (08/30 0436) BP: 111/76 (08/30 0436) Pulse Rate: 63 (08/30 0436)  Labs: Recent Labs    05/17/22 0301 05/17/22 0800 05/17/22 1319 05/17/22 1939 05/18/22 0452 05/18/22 0456  HGB 12.4*  --   --   --   --  13.0  HCT 37.2*  --   --   --   --  41.0  PLT 240  --   --   --   --  249  HEPARINUNFRC  --   --  0.65 0.46 0.61  --   CREATININE 1.13  --   --   --   --   --   TROPONINIHS 3 3  --   --   --   --      Estimated Creatinine Clearance: 81.1 mL/min (by C-G formula based on SCr of 1.13 mg/dL).   Medications:  Infusions:   heparin 1,400 Units/hr (05/17/22 1753)    Assessment: 12 yoM presented to ED on 8/29 with several days of calf pain/swelling, and DOE.  Found to have new onset DVT and PE and started on IV heparin per pharmacy dosing.    Heparin level 0.61, remains therapeutic on heparin 1400 units/hr CBC stable No bleeding or complications reported  Goal of Therapy:  Heparin level 0.3-0.7 units/ml Monitor platelets by anticoagulation protocol: Yes   Plan:  Continue heparin IV infusion at 1400 units/hr Daily heparin level and CBC Follow up transition to PO anticoagulation   ADDENDUM 8/30 1415: Consult to transition to Eliquis for DVT/PE. Start Eliquis 10 mg BID x 7 days followed by 5 mg BID.  Tawnya Crook, PharmD, BCPS Clinical Pharmacist 05/18/2022 7:12 AM

## 2022-05-18 NOTE — Discharge Summary (Signed)
Physician Discharge Summary  Daril Warga TIW:[id number] DOB: 1966/08/15 DOA: 05/17/2022  PCP: Does not have a PCP  Admit date: 05/17/2022 Discharge date: 05/18/2022 Discharging to: Home Recommendations for Outpatient Follow-up:  Recommend follow-up with hematology  Consults:  None     Discharge Diagnoses:   Principal Problem:   Acute pulmonary embolism ([place]) Active Problems:   DVT (deep venous thrombosis) [facility])     Hospital Course:  This is a 56 year old male who presented to the hospital for right calf pain followed by swelling and then followed by dyspnea on exertion. He underwent a CT scan in the ED which was positive for acute lobar and segmental PE with evidence of right heart strain consistent with at least submassive PE. A venous ultrasound of the lower extremity showed occlusive DVT within the right popliteal right posterior tibial and right peroneal veins.  Principal Problem:   Acute pulmonary embolism ([place])   DVT (deep venous thrombosis) ([place]) --The patient is not hypoxic and has no chest pain. - On exam today, his pedal edema has resolved -CT scan was suggestive of right heart strain however, 2D echo is not and hemodynamics are stable - He has been stable on a heparin infusion which we are transitioning to Eliquis today - he has no PCP and he is going to be referred to the [facility] wellness [facility] and will receive assistance with his Eliquis - He does state that his father had a PE as well but he is not fully aware of the circumstances of his PE and whether or not it was provoked - In regards to the patient, he had a long car ride to [place] in early August and states that he took 1 maybe 2 rest breaks and this could have been a trigger-otherwise, he states he is an extremely active person and is not a smoker          Discharge Instructions  Discharge Instructions     Diet - low sodium heart healthy   Complete by: As directed    Increase activity  slowly   Complete by: As directed       Allergies as of 05/18/2022   No Known Allergies      Medication List     TAKE these medications    Apixaban Starter Pack ('10mg'$  and '5mg'$ ) Commonly known as: ELIQUIS STARTER PACK Take as directed on package: start with two-'5mg'$  tablets twice daily for 7 days. On day 8, switch to one-'5mg'$  tablet twice daily.        Follow-up [facility] Follow up on 06/08/2022.   Why: be at the clinic at 215 for appointment at 230pm to complete paperwork    DO NOT MISS THIS APPOINTMENT! Contact information: [place]  [phone]                   The results of significant diagnostics from this hospitalization (including imaging, microbiology, ancillary and laboratory) are listed below for reference.    ECHOCARDIOGRAM COMPLETE  Result Date: 05/18/2022    ECHOCARDIOGRAM REPORT   Patient Name:   REHAN Harada Date of Exam: 05/18/2022 Medical Rec #:  [id number]      Height:       72.0 in Accession #:    [id number]     Weight:       180.8 lb Date of Birth:  11/27/1965  BSA:          2.041 m Patient Age:    56 years       BP:           134/86 mmHg Patient Gender: M              HR:           76 bpm. Exam Location:  Inpatient Procedure: 2D Echo, 3D Echo, Cardiac Doppler, Color Doppler and Strain Analysis Indications:    Pulmonary Embolus I26.09  History:        Patient has no prior history of Echocardiogram examinations.  Sonographer:    Darlina Sicilian RDCS Referring Phys: [place]  1. Left ventricular ejection fraction, by estimation, is 60 to 65%. The left ventricle has normal function. The left ventricle has no regional wall motion abnormalities. Left ventricular diastolic parameters are consistent with Grade I diastolic dysfunction (impaired relaxation). The average left ventricular global longitudinal strain is -19.2 %. The  global longitudinal strain is normal.  2. Right ventricular systolic function is normal. The right ventricular size is mildly enlarged. Tricuspid regurgitation signal is inadequate for assessing PA pressure.  3. The mitral valve is normal in structure. No evidence of mitral valve regurgitation. No evidence of mitral stenosis.  4. The aortic valve is tricuspid. Aortic valve regurgitation is not visualized. No aortic stenosis is present.  5. The inferior vena cava is normal in size with greater than 50% respiratory variability, suggesting right atrial pressure of 3 mmHg. FINDINGS  Left Ventricle: Left ventricular ejection fraction, by estimation, is 60 to 65%. The left ventricle has normal function. The left ventricle has no regional wall motion abnormalities. The average left ventricular global longitudinal strain is -19.2 %. The global longitudinal strain is normal. The left ventricular internal cavity size was normal in size. There is no left ventricular hypertrophy. Left ventricular diastolic parameters are consistent with Grade I diastolic dysfunction (impaired relaxation). Normal left ventricular filling pressure. Right Ventricle: The right ventricular size is mildly enlarged. No increase in right ventricular wall thickness. Right ventricular systolic function is normal. Tricuspid regurgitation signal is inadequate for assessing PA pressure. Left Atrium: Left atrial size was normal in size. Right Atrium: Right atrial size was normal in size. Pericardium: There is no evidence of pericardial effusion. Mitral Valve: The mitral valve is normal in structure. No evidence of mitral valve regurgitation. No evidence of mitral valve stenosis. Tricuspid Valve: The tricuspid valve is normal in structure. Tricuspid valve regurgitation is not demonstrated. No evidence of tricuspid stenosis. Aortic Valve: The aortic valve is tricuspid. Aortic valve regurgitation is not visualized. No aortic stenosis is present. Pulmonic Valve:  The pulmonic valve was normal in structure. Pulmonic valve regurgitation is not visualized. No evidence of pulmonic stenosis. Aorta: The aortic root is normal in size and structure. Venous: The inferior vena cava is normal in size with greater than 50% respiratory variability, suggesting right atrial pressure of 3 mmHg. IAS/Shunts: No atrial level shunt detected by color flow Doppler.  LEFT VENTRICLE PLAX 2D LVIDd:         4.40 cm   Diastology LVIDs:         2.70 cm   LV e' medial:    4.24 cm/s LV PW:         1.00 cm   LV E/e' medial:  12.3 LV IVS:        1.10 cm   LV e' lateral:   10.20  cm/s LVOT diam:     2.10 cm   LV E/e' lateral: 5.1 LV SV:         73 LV SV Index:   36        2D Longitudinal Strain LVOT Area:     3.46 cm  2D Strain GLS Avg:     -19.2 %                           3D Volume EF:                          3D EF:        58 %                          LV EDV:       117 ml                          LV ESV:       50 ml                          LV SV:        67 ml RIGHT VENTRICLE RV Basal diam:  4.75 cm RV Mid diam:    2.50 cm RV S prime:     15.30 cm/s TAPSE (M-mode): 2.5 cm LEFT ATRIUM             Index        RIGHT ATRIUM           Index LA diam:        3.30 cm 1.62 cm/m   RA Area:     12.70 cm LA Vol (A2C):   32.6 ml 15.97 ml/m  RA Volume:   24.65 ml  12.08 ml/m LA Vol (A4C):   29.3 ml 14.36 ml/m LA Biplane Vol: 31.3 ml 15.34 ml/m  AORTIC VALVE LVOT Vmax:   106.00 cm/s LVOT Vmean:  72.400 cm/s LVOT VTI:    0.211 m  AORTA Ao Root diam: 3.00 cm Ao Asc diam:  3.00 cm MITRAL VALVE MV Area (PHT): 2.79 cm    SHUNTS MV Decel Time: 272 msec    Systemic VTI:  0.21 m MV E velocity: 52.30 cm/s  Systemic Diam: 2.10 cm MV A velocity: 71.10 cm/s MV E/A ratio:  0.74 Mihai Croitoru MD Electronically signed by Sanda Klein MD Signature Date/Time: 05/18/2022/12:54:38 PM    Final    CT Angio Chest PE W and/or Wo Contrast  Result Date: 05/17/2022 CLINICAL DATA:  Pulmonary embolism suspected, high probability.  EXAM: CT ANGIOGRAPHY CHEST WITH CONTRAST TECHNIQUE: Multidetector CT imaging of the chest was performed using the standard protocol during bolus administration of intravenous contrast. Multiplanar CT image reconstructions and MIPs were obtained to evaluate the vascular anatomy. RADIATION DOSE REDUCTION: This exam was performed according to the departmental dose-optimization program which includes automated exposure control, adjustment of the mA and/or kV according to patient size and/or use of iterative reconstruction technique. CONTRAST:  42m OMNIPAQUE IOHEXOL 350 MG/ML SOLN COMPARISON:  None Available. FINDINGS: Cardiovascular: Branching pulmonary emboli in the bilateral lungs affecting right lobar and diffuse segmental branches. All lobes are affected. Heart size is normal but the RV to LV ratio is abnormal at 1.2. Negative aorta. Mild coronary calcification at the LAD. Mediastinum/Nodes:  No adenopathy or mass Lungs/Pleura: No pulmonary edema or pulmonary infarct. Upper Abdomen: Negative Musculoskeletal: Negative Review of the MIP images confirms the above findings. Critical Value/emergent results were called by telephone at the time of interpretation on 05/17/2022 at 4:22 am to provider Veryl Speak , who verbally acknowledged these results. IMPRESSION: Positive for acute lobar and segmental pulmonary embolism with CT evidence of right heart strain (RV/LV Ratio = 1.2) consistent with at least submassive (intermediate risk) PE. The presence of right heart strain has been associated with an increased risk of morbidity and mortality. Please refer to the "Code PE Focused" order set in EPIC. Electronically Signed   By: Jorje Guild M.D.   On: 05/17/2022 04:25   US Venous Img Lower Unilateral Right (DVT)  Result Date: 05/17/2022 CLINICAL DATA:  Right calf pain and swelling. EXAM: RIGHT LOWER EXTREMITY VENOUS DOPPLER ULTRASOUND TECHNIQUE: Gray-scale sonography with graded compression, as well as color Doppler  and duplex ultrasound were performed to evaluate the lower extremity deep venous systems from the level of the common femoral vein and including the common femoral, femoral, profunda femoral, popliteal and calf veins including the posterior tibial, peroneal and gastrocnemius veins when visible. The superficial great saphenous vein was also interrogated. Spectral Doppler was utilized to evaluate flow at rest and with distal augmentation maneuvers in the common femoral, femoral and popliteal veins. COMPARISON:  None Available. FINDINGS: Contralateral Common Femoral Vein: Respiratory phasicity is normal and symmetric with the symptomatic side. No evidence of thrombus. Normal compressibility. Common Femoral Vein: No evidence of thrombus. Normal compressibility, respiratory phasicity and response to augmentation. Saphenofemoral Junction: No evidence of thrombus. Normal compressibility and flow on color Doppler imaging. Profunda Femoral Vein: No evidence of thrombus. Normal compressibility and flow on color Doppler imaging. Femoral Vein: No evidence of thrombus. Normal compressibility, respiratory phasicity and response to augmentation. Popliteal Vein: Evidence of occlusive thrombus with abnormal compressibility, respiratory phasicity and response to augmentation. Calf Veins: Evidence of occlusive thrombus with abnormal compressibility and flow on color Doppler imaging. Superficial Great Saphenous Vein: No evidence of thrombus. Normal compressibility. Venous Reflux:  None. Other Findings:  None. IMPRESSION: Findings consistent with occlusive DVT within the RIGHT popliteal, RIGHT posterior tibial and RIGHT peroneal veins. Electronically Signed   By: Virgina Norfolk M.D.   On: 05/17/2022 02:22   Labs:   Basic Metabolic Panel: Recent Labs  Lab 05/17/22 0301  NA 138  K 4.1  CL 105  CO2 26  GLUCOSE 111*  BUN 25*  CREATININE 1.13  CALCIUM 9.5     CBC: Recent Labs  Lab 05/17/22 0301 05/18/22 0456  WBC 6.9  5.8  NEUTROABS 4.0  --   HGB 12.4* 13.0  HCT 37.2* 41.0  MCV 77.5* 81.3  PLT 240 249         SIGNED:   Debbe Odea, MD  [facility] 05/18/2022, 2:15 PM

## 2022-05-18 NOTE — Plan of Care (Signed)

## 2022-05-18 NOTE — TOC Initial Note (Signed)
Transition of Care [facility]) - Initial/Assessment Note    Patient Details  Name: Arael Piccione MRN: [id number] Date of Birth: Jun 10, 1966  Transition of Care [facility]) CM/SW Contact:    Leeroy Cha, RN Phone Number: 05/18/2022, 7:08 AM  Clinical Narrative:                  Transition of Care [facility]) Screening Note   Patient Details  Name: Vaishnav Demartin Date of Birth: 21-Aug-1966   Transition of Care [facility]) CM/SW Contact:    Leeroy Cha, RN Phone Number: 05/18/2022, 7:08 AM    Transition of Care Department [facility]) has reviewed patient and no TOC needs have been identified at this time. We will continue to monitor patient advancement through interdisciplinary progression rounds. If new patient transition needs arise, please place a TOC consult.    Expected Discharge Plan: Home/Self Care Barriers to Discharge: Continued Medical Work up   Patient Goals and CMS Choice Patient states their goals for this hospitalization and ongoing recovery are:: to go back home and get to running again. CMS Medicare.gov Compare Post Acute Care list provided to:: Patient Choice offered to / list presented to : Patient  Expected Discharge Plan and Services Expected Discharge Plan: Home/Self Care   Discharge Planning Services: CM Consult   Living arrangements for the past 2 months: Single Family Home                                      Prior Living Arrangements/Services Living arrangements for the past 2 months: Single Family Home Lives with:: Spouse Patient language and need for interpreter reviewed:: Yes Do you feel safe going back to the place where you live?: Yes            Criminal Activity/Legal Involvement Pertinent to Current Situation/Hospitalization: No - Comment as needed  Activities of Daily Living Home Assistive Devices/Equipment: None ADL Screening (condition at time of admission) Patient's cognitive ability adequate to safely complete daily activities?:  Yes Is the patient deaf or have difficulty hearing?: No Does the patient have difficulty seeing, even when wearing glasses/contacts?: No Does the patient have difficulty concentrating, remembering, or making decisions?: No Patient able to express need for assistance with ADLs?: Yes Does the patient have difficulty dressing or bathing?: No Independently performs ADLs?: Yes (appropriate for developmental age) Does the patient have difficulty walking or climbing stairs?: No Weakness of Legs: None Weakness of Arms/Hands: None  Permission Sought/Granted                  Emotional Assessment Appearance:: Appears stated age Attitude/Demeanor/Rapport: Engaged Affect (typically observed): Calm Orientation: : Oriented to Self, Oriented to Place, Oriented to  Time Alcohol / Substance Use: Never Used Psych Involvement: No (comment)  Admission diagnosis:  Acute pulmonary embolism ([place]) [I26.99] Patient Active Problem List   Diagnosis Date Noted   Acute pulmonary embolism ([place]) 05/17/2022   DVT (deep venous thrombosis) ([place]) 05/17/2022   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

## 2022-05-18 NOTE — Progress Notes (Signed)
Pharmacy educated patient on eliquis starter pack. Patient verbalized understanding. IV and tele removed. Patient dressed in personal clothing. Patient awaiting ride home.

## 2022-05-18 NOTE — Progress Notes (Signed)
  Echocardiogram 2D Echocardiogram has been performed.  Darlina Sicilian M 05/18/2022, 12:01 PM

## 2022-05-18 NOTE — Progress Notes (Signed)
Received report from off going nurse. Agree with previous assessment. Pt belongings and call light within reach.

## 2022-05-18 NOTE — Telephone Encounter (Signed)
Pharmacy Patient Advocate Encounter  Insurance verification completed.    The patient does not have any prescription coverage  The patient is currently admitted and ran test claims for the following: Eliquis, Xarelto.  Copays and coinsurance results were relayed to Inpatient clinical team.

## 2022-05-18 NOTE — TOC Progression Note (Signed)
Transition of Care [facility]) - Progression Note    Patient Details  Name: James Burns MRN: [id number] Date of Birth: [birth year]  Transition of Care [facility]) CM/SW Contact  Leeroy Cha, RN Phone Number: 05/18/2022, 9:53 AM  Clinical Narrative:    Patient has an appointment with the [facility] September 20th at 2:30pm.  Added to dc instructions.   Expected Discharge Plan: Home/Self Care Barriers to Discharge: Continued Medical Work up  Expected Discharge Plan and Services Expected Discharge Plan: Home/Self Care   Discharge Planning Services: CM Consult   Living arrangements for the past 2 months: Single Family Home                                       Social Determinants of Health (SDOH) Interventions    Readmission Risk Interventions   No data to display

## 2022-06-08 ENCOUNTER — Encounter: Payer: Self-pay | Admitting: Family Medicine

## 2022-06-08 ENCOUNTER — Ambulatory Visit: Payer: Self-pay | Attending: Family Medicine | Admitting: Family Medicine

## 2022-06-08 ENCOUNTER — Other Ambulatory Visit: Payer: Self-pay

## 2022-06-08 VITALS — BP 128/80 | HR 75 | Temp 98.3°F | Ht 72.0 in | Wt 189.8 lb

## 2022-06-08 DIAGNOSIS — Z23 Encounter for immunization: Secondary | ICD-10-CM

## 2022-06-08 DIAGNOSIS — I82431 Acute embolism and thrombosis of right popliteal vein: Secondary | ICD-10-CM

## 2022-06-08 DIAGNOSIS — I2699 Other pulmonary embolism without acute cor pulmonale: Secondary | ICD-10-CM

## 2022-06-08 MED ORDER — APIXABAN 5 MG PO TABS
5.0000 mg | ORAL_TABLET | Freq: Two times a day (BID) | ORAL | 2 refills | Status: DC
  Filled 2022-06-08: qty 28, 14d supply, fill #0
  Filled 2022-07-01: qty 30, 15d supply, fill #1
  Filled 2022-08-04 (×2): qty 60, 30d supply, fill #2
  Filled 2022-09-07: qty 60, 30d supply, fill #0
  Filled 2022-10-10: qty 60, 30d supply, fill #1

## 2022-06-08 NOTE — Progress Notes (Unsigned)
Subjective:  Patient ID: James Burns, male    DOB: 1965-11-09  Age: 56 y.o. MRN: [id number]  CC: Hospitalization Follow-up (Hospitalization f/u. Discuss the blood clots and hospitalization.)   HPI James Burns is a 56 y.o. year old male with no significant past medical history who presents to establish care. Diagnosed with acute pulmonary edema, acute DVT during hospitalization from 05/17/2022 through 05/18/2022 after he presented with dyspnea and right leg pain. Of note he had undertaken a long trip to [place] but had taken about 2 rest stops during his trip.  CTA chest revealed: IMPRESSION: Positive for acute lobar and segmental pulmonary embolism with CT evidence of right heart strain (RV/LV Ratio = 1.2) consistent with at least submassive (intermediate risk) PE. The presence of right heart strain has been associated with an increased risk of morbidity and mortality. Please refer to the "Code PE Focused" order set in EPIC.   Doppler of lower extremities revealed: IMPRESSION: Findings consistent with occlusive DVT within the RIGHT popliteal, RIGHT posterior tibial and RIGHT peroneal veins.   Eliquis was initiated and he was subsequently discharged.  Interval History:  He does have intermittent dyspnea on exertion but no leg pain.  Endorses adherence with Eliquis and is wondering if he needs to see a pulmonologist for repeat CTA chest.  He has been on Eliquis for 1 month and oxygen saturation is 98% today.  He has not had any bruising or bleeding. Notes reveal questionable history of thromboembolic disorder in dad of unknown etiology.  He is very active and works out regularly. Denies additional concerns. Past Medical History:  Diagnosis Date   Cancer ([place])    Melanoma   Medical history non-contributory    Wears glasses     Past Surgical History:  Procedure Laterality Date   MELANOMA EXCISION WITH SENTINEL LYMPH NODE BIOPSY Right 11/30/2012   Procedure: MELANOMA  wide EXCISION right back  WITH SENTINEL LYMPH NODE BIOPSY nuclear medicine injection 7:00;  Surgeon: Zenovia Jarred, MD;  Location: [place];  Service: General;  Laterality: Right;  melanoma nuclear medicine injection at 7:00 per Toni    Rotator cuff surgery Bilateral    SIGMOIDOSCOPY     TONSILLECTOMY     WISDOM TOOTH EXTRACTION      History reviewed. No pertinent family history.  Social History   Socioeconomic History   Marital status: Married    Spouse name: Not on file   Number of children: Not on file   Years of education: Not on file   Highest education level: Not on file  Occupational History   Not on file  Tobacco Use   Smoking status: Never   Smokeless tobacco: Not on file  Substance and Sexual Activity   Alcohol use: No   Drug use: No   Sexual activity: Not on file  Other Topics Concern   Not on file  Social History Narrative   Not on file   Social Determinants of Health   Financial Resource Strain: Not on file  Food Insecurity: Not on file  Transportation Needs: Not on file  Physical Activity: Not on file  Stress: Not on file  Social Connections: Not on file    No Known Allergies  Outpatient Medications Prior to Visit  Medication Sig Dispense Refill   APIXABAN (ELIQUIS) VTE STARTER PACK ('10MG'$  AND '5MG'$ ) Take as directed on package: start with two-'5mg'$  tablets twice daily for 7 days. On day 8, switch to one-'5mg'$  tablet twice daily. [place]  tablet 0   No facility-administered medications prior to visit.     ROS Review of Systems  Constitutional:  Negative for activity change and appetite change.  HENT:  Negative for sinus pressure and sore throat.   Respiratory:  Positive for shortness of breath. Negative for chest tightness and wheezing.   Cardiovascular:  Negative for chest pain and palpitations.  Gastrointestinal:  Negative for abdominal distention, abdominal pain and constipation.  Genitourinary: Negative.   Musculoskeletal: Negative.    Psychiatric/Behavioral:  Negative for behavioral problems and dysphoric mood.     Objective:  BP 128/80 (BP Location: Left Arm, Patient Position: Sitting, Cuff Size: Normal)   Pulse 75   Temp 98.3 F (36.8 C) (Oral)   Ht 6' (1.829 m)   Wt 189 lb 12.8 oz (86.1 kg)   SpO2 98%   BMI 25.74 kg/m      06/08/2022    2:27 PM 05/18/2022   12:29 PM 05/18/2022    9:24 AM  BP/Weight  Systolic BP 176 160 737  Diastolic BP 80 83 86  Wt. (Lbs) 189.8    BMI 25.74 kg/m2        Physical Exam Constitutional:      Appearance: He is well-developed.  Cardiovascular:     Rate and Rhythm: Normal rate.     Heart sounds: Normal heart sounds. No murmur heard. Pulmonary:     Effort: Pulmonary effort is normal.     Breath sounds: Normal breath sounds. No wheezing or rales.  Chest:     Chest wall: No tenderness.  Abdominal:     General: Bowel sounds are normal. There is no distension.     Palpations: Abdomen is soft. There is no mass.     Tenderness: There is no abdominal tenderness.  Musculoskeletal:        General: Normal range of motion.     Right lower leg: No edema.     Left lower leg: No edema.  Neurological:     Mental Status: He is alert and oriented to person, place, and time.  Psychiatric:        Mood and Affect: Mood normal.        Latest Ref Rng & Units 05/17/2022    3:01 AM 01/23/2013    1:30 PM  CMP  Glucose 70 - 99 mg/dL 111  100   BUN 6 - 20 mg/dL 25  28.7   Creatinine 0.61 - 1.24 mg/dL 1.13  1.1   Sodium 135 - 145 mmol/L 138  138   Potassium 3.5 - 5.1 mmol/L 4.1  4.0   Chloride 98 - 111 mmol/L 105  105   CO2 22 - 32 mmol/L 26  26   Calcium 8.9 - 10.3 mg/dL 9.5  9.6   Total Protein 6.4 - 8.3 g/dL  7.3   Total Bilirubin 0.20 - 1.20 mg/dL  0.49   Alkaline Phos 40 - 150 U/L  77   AST 5 - 34 U/L  25   ALT 0 - 55 U/L  29     Lipid Panel  No results found for: "CHOL", "TRIG", "HDL", "CHOLHDL", "VLDL", "LDLCALC", "LDLDIRECT"  CBC    Component Value Date/Time    WBC 5.8 05/18/2022 0456   RBC 5.04 05/18/2022 0456   HGB 13.0 05/18/2022 0456   HGB 14.5 01/23/2013 1330   HCT 41.0 05/18/2022 0456   HCT 43.7 01/23/2013 1330   PLT 249 05/18/2022 0456   PLT 211 01/23/2013 1330   MCV  81.3 05/18/2022 0456   MCV 83.4 01/23/2013 1330   MCH 25.8 (L) 05/18/2022 0456   MCHC 31.7 05/18/2022 0456   RDW 16.8 (H) 05/18/2022 0456   RDW 14.0 01/23/2013 1330   LYMPHSABS 1.7 05/17/2022 0301   LYMPHSABS 1.5 01/23/2013 1330   MONOABS 1.0 05/17/2022 0301   MONOABS 0.6 01/23/2013 1330   EOSABS 0.2 05/17/2022 0301   EOSABS 0.1 01/23/2013 1330   BASOSABS 0.0 05/17/2022 0301   BASOSABS 0.0 01/23/2013 1330    No results found for: "HGBA1C"  Assessment & Plan:  1. Acute pulmonary embolism without acute cor pulmonale, unspecified pulmonary embolism type (HCC) Diagnosed in 05/17/2022 Endorses adherence with Eliquis No hypercoagulable panel on file Questionable family history of embolic disorder in dad of unknown etiology We will refer to hematology to determine duration of anticoagulation He does have some residual dyspnea but oxygen saturation is reassuring.  Advised to continue with course of anticoagulation and if he remains dyspneic at next visit pulmonary referral is a consideration At this point in time guidelines no recommend repeat CTA Advised to apply for the  financial discount to facilitate referral to specialist - Ambulatory referral to Hematology / Oncology - apixaban (ELIQUIS) 5 MG TABS tablet; Take 1 tablet (5 mg total) by mouth 2 (two) times daily.  Dispense: 60 tablet; Refill: 2  2. Acute deep vein thrombosis (DVT) of popliteal vein of right lower extremity (HCC) Asymptomatic - Ambulatory referral to Hematology / Oncology - apixaban (ELIQUIS) 5 MG TABS tablet; Take 1 tablet (5 mg total) by mouth 2 (two) times daily.  Dispense: 60 tablet; Refill: 2  3. Need for immunization against influenza - Flu Vaccine QUAD 63moIM (Fluarix, Fluzone &  Alfiuria Quad PF)    Meds ordered this encounter  Medications   apixaban (ELIQUIS) 5 MG TABS tablet    Sig: Take 1 tablet (5 mg total) by mouth 2 (two) times daily.    Dispense:  60 tablet    Refill:  2    Follow-up: Return in about 2 months (around 08/08/2022) for Pulmonary Embolism.       ECharlott Rakes MD, FAAFP. CUnion [facility] LLCand WLittle [facility] [place]  06/08/2022, 4:40 PM

## 2022-06-08 NOTE — Patient Instructions (Signed)
Pulmonary Embolism  A pulmonary embolism (PE) is a sudden blockage or decrease of blood flow in one or both lungs that happens when a clot travels into the arteries of the lung (pulmonary arteries). Most blockages come from a blood clot that forms in the vein of a leg or arm (deep vein thrombosis, DVT) and travels to the lungs. A clot is blood that has thickened into a gel or solid. PE is a dangerous and life-threatening condition that needs to be treated right away. What are the causes? This condition is usually caused by a blood clot that forms in a vein and moves to the lungs. In rare cases, it may be caused by air, fat, part of a tumor, or other tissue that moves through the veins and into the lungs. What increases the risk? The following factors may make you more likely to develop this condition: Experiencing a traumatic injury, such as breaking a hip or leg. Having: A spinal cord injury. Major surgery, especially hip or knee replacement, or surgery on parts of the nervous system or on the abdomen. A stroke. A blood-clotting disease. Long-term (chronic) lung or heart disease. Cancer, especially if you are being treated with chemotherapy. A central venous catheter. Taking medicines that contain estrogen. These include birth control pills and hormone replacement therapy. Being: Pregnant. In the period of time after your baby is delivered (postpartum). Older than age 60. Overweight. A smoker, especially if you have other risks. Not very active (sedentary), not being able to move at all, or spending long periods sitting, such as travel over 6 hours. You are also at a greater risk if you have a leg in a cast or splint. What are the signs or symptoms? Symptoms of this condition usually start suddenly and include: Shortness of breath during activity or at rest. Coughing, coughing up blood, or coughing up bloody mucus. Chest pain, back pain, or shoulder blade pain that gets worse with deep  breaths. Rapid or irregular heartbeat. Feeling light-headed or dizzy, or fainting. Feeling anxious. Pain and swelling in a leg. This is a symptom of DVT, which can lead to PE. How is this diagnosed? This condition may be diagnosed based on your medical history, a physical exam, and tests. Tests may include: Blood tests. An ECG (electrocardiogram) of the heart. A CT pulmonary angiogram. This test checks blood flow in and around your lungs. A ventilation-perfusion scan, also called a lung VQ scan. This test measures air flow and blood flow to the lungs. An ultrasound to check for a DVT. How is this treated? Treatment for this condition depends on many factors, such as the cause of your PE, your risk for bleeding or developing more clots, and other medical conditions you may have. Treatment aims to stop blood clots from forming or growing larger. In some cases, treatment may be aimed at breaking apart or removing the blood clot. Treatment may include: Medicines, such as: Blood thinning medicines, also called anticoagulants, to stop clots from forming and growing. Medicines that break apart clots (fibrinolytics). Procedures, such as: Using a flexible tube to remove a blood clot (embolectomy) or to deliver medicine to destroy it (catheter-directed thrombolysis). Surgery to remove the clot (surgical embolectomy). This is rare. You may need a combination of immediate, long-term, and extended treatments. Your treatment may continue for several months (maintenance therapy) or longer depending on your medical conditions. You and your health care provider will work together to choose the treatment program that is best for you.   Follow these instructions at home: Medicines Take over-the-counter and prescription medicines only as told by your health care provider. If you are taking blood thinners: Talk with your health care provider before you take any medicines that contain aspirin or NSAIDs, such as  ibuprofen. These medicines increase your risk for dangerous bleeding. Take your medicine exactly as told, at the same time every day. Avoid activities that could cause injury or bruising, and follow instructions about how to prevent falls. Wear a medical alert bracelet or carry a card that lists what medicines you take. Understand what foods and drugs interact with any medicines that you are taking. General instructions Ask your health care provider when you may return to your normal activities. Avoid sitting or lying for a long time without moving. Maintain a healthy weight. Ask your health care provider what weight is healthy for you. Do not use any products that contain nicotine or tobacco. These products include cigarettes, chewing tobacco, and vaping devices, such as e-cigarettes. If you need help quitting, ask your health care provider. Talk with your health care provider about any travel plans. It is important to make sure that you are still able to take your medicine while traveling. Keep all follow-up visits. This is important. Where to find more information American Lung Association: www.lung.org Centers for Disease Control and Prevention: www.cdc.gov Contact a health care provider if: You missed a dose of your blood thinner medicine. You have a fever. Get help right away if: You have: New or increased pain, swelling, warmth, or redness in an arm or leg. Shortness of breath that gets worse during activity or at rest. Worsening chest pain. A rapid or irregular heartbeat. A severe headache. Vision changes. A serious fall or accident, or you hit your head. Blood in your vomit, stool, or urine. A cut that will not stop bleeding. You cough up blood. You feel light-headed or dizzy, and that feeling does not go away. You cannot move your arms or legs. You are confused or have memory loss. These symptoms may represent a serious problem that is an emergency. Do not wait to see if the  symptoms will go away. Get medical help right away. Call your local emergency services (911 in the [place] Do not drive yourself to the hospital. Summary A pulmonary embolism (PE) is a serious and potentially life-threatening condition. It happens when a blood clot from one part of the body travels to the arteries of the lung, causing a sudden blockage or decrease of blood flow to the lungs. This may result in shortness of breath, chest pain, dizziness, and fainting. Treatments for this condition usually include medicines to thin your blood (anticoagulants) or medicines to break apart blood clots. If you are given blood thinners, take your medicine exactly as told by your health care provider, at the same time every day. This is important. Understand what foods and drugs interact with any medicines that you are taking. If you have signs of PE or DVT, call your local emergency services (911 in the [place] This information is not intended to replace advice given to you by your health care provider. Make sure you discuss any questions you have with your health care provider. Document Revised: 08/07/2020 Document Reviewed: 08/07/2020 Elsevier Patient Education  2023 [employer].  

## 2022-06-09 ENCOUNTER — Telehealth: Payer: Self-pay | Admitting: Hematology and Oncology

## 2022-06-09 NOTE — Telephone Encounter (Signed)
Scheduled appt per 9/20 referral. Pt is aware of appt date and time. Pt is aware to arrive 15 mins prior to appt time and to bring and updated insurance card. Pt is aware of appt location.   

## 2022-06-10 ENCOUNTER — Other Ambulatory Visit: Payer: Self-pay

## 2022-06-16 ENCOUNTER — Encounter (HOSPITAL_BASED_OUTPATIENT_CLINIC_OR_DEPARTMENT_OTHER): Payer: Self-pay | Admitting: Family Medicine

## 2022-06-16 ENCOUNTER — Other Ambulatory Visit: Payer: Self-pay

## 2022-06-16 DIAGNOSIS — N529 Male erectile dysfunction, unspecified: Secondary | ICD-10-CM

## 2022-06-16 MED ORDER — SILDENAFIL CITRATE 50 MG PO TABS
50.0000 mg | ORAL_TABLET | Freq: Every day | ORAL | 1 refills | Status: DC | PRN
  Filled 2022-06-16: qty 10, 30d supply, fill #0
  Filled 2022-12-07: qty 10, 30d supply, fill #1

## 2022-06-16 NOTE — Telephone Encounter (Signed)

## 2022-06-21 ENCOUNTER — Other Ambulatory Visit: Payer: Self-pay

## 2022-06-22 ENCOUNTER — Telehealth: Payer: Self-pay | Admitting: Hematology and Oncology

## 2022-06-22 NOTE — Telephone Encounter (Signed)
Rescheduled appointment per provider on-call. Patient is aware of the changes made to her upcoming appointment. 

## 2022-06-24 ENCOUNTER — Other Ambulatory Visit: Payer: Self-pay

## 2022-07-01 ENCOUNTER — Other Ambulatory Visit: Payer: Self-pay

## 2022-07-07 ENCOUNTER — Encounter: Payer: Self-pay | Admitting: Hematology and Oncology

## 2022-07-07 ENCOUNTER — Inpatient Hospital Stay: Payer: Self-pay | Attending: Hematology and Oncology | Admitting: Hematology and Oncology

## 2022-07-07 ENCOUNTER — Inpatient Hospital Stay: Payer: Self-pay

## 2022-07-07 VITALS — BP 138/90 | HR 76 | Temp 97.9°F | Resp 16 | Wt 189.2 lb

## 2022-07-07 DIAGNOSIS — I82431 Acute embolism and thrombosis of right popliteal vein: Secondary | ICD-10-CM | POA: Insufficient documentation

## 2022-07-07 DIAGNOSIS — Z85820 Personal history of malignant melanoma of skin: Secondary | ICD-10-CM | POA: Insufficient documentation

## 2022-07-07 DIAGNOSIS — C4359 Malignant melanoma of other part of trunk: Secondary | ICD-10-CM

## 2022-07-07 DIAGNOSIS — I2699 Other pulmonary embolism without acute cor pulmonale: Secondary | ICD-10-CM | POA: Insufficient documentation

## 2022-07-07 DIAGNOSIS — Z7901 Long term (current) use of anticoagulants: Secondary | ICD-10-CM | POA: Insufficient documentation

## 2022-07-07 LAB — ANTITHROMBIN III: AntiThromb III Func: 107 % (ref 75–120)

## 2022-07-07 NOTE — Progress Notes (Signed)
[place] CONSULT NOTE  Patient Care Team: London Pepper, MD as PCP - General (Family Medicine)  CHIEF COMPLAINTS/PURPOSE OF CONSULTATION:  PE  ASSESSMENT & PLAN:  Malignant melanoma of back This is a very pleasant 56 year old male patient with melanoma now diagnosed with acute DVT on the right lower extremity and submassive PE now on anticoagulation with Eliquis referred to hematology for additional recommendations.  There appears to be no evidence of provoking factors prior to this DVT.  No significant family history of thromboembolic disorders, dad had blood clot but this was in the setting of lymphoma.  He does right long-distance bike 2-3 times a week about 15 to 20 miles and is training for a long bike ride.  He did significantly improve after initiating anticoagulation, no more lower extremity swelling or shortness of breath.  We have today discussed about considering hypercoagulable work-up since this is an unprovoked DVT.  We have also discussed that the typical duration of anticoagulation is about 6 months. Sometimes we do consider it in definitively if there is a persistent risk factor.  We discussed that there is no role for repeat imaging because this may not necessarily change our management.  We have discussed that some people take months to years to clear the clot.  As long as his symptoms continue to improve, he may be resolving the clot slowly.  He was wondering if he needs to see a pulmonologist.  This is certainly reasonable, he may have to talk to his PCP for consult. He also made a list of questions that his wife had.  We have discussed that there is no role for PT PTT testing while on Eliquis.  We have discussed about risks of anticoagulation including life-threatening bleeding.  He was agreeable to the hypercoagulable work-up.  He is working with our financial department to obtain Eliquis since he is self-pay he states.  He can return to clinic in 2 weeks for  televisit to review the lab results and to discuss additional recommendations.  All his questions were answered to the best of my knowledge.  Thank you for consulting [place] the care of this patient.  Please do not hesitate to contact us with any additional questions or concerns  I could not really get any history concerning for melanoma recurrence based on review of systems and physical examination findings  Orders Placed This Encounter  Procedures   Lupus anticoagulant panel    Standing Status:   Future    Number of Occurrences:   1    Standing Expiration Date:   07/08/2023   Prothrombin gene mutation    Standing Status:   Future    Number of Occurrences:   1    Standing Expiration Date:   07/08/2023   Factor 5 leiden    Standing Status:   Future    Number of Occurrences:   1    Standing Expiration Date:   07/08/2023   Hexagonal Phospholipid Neutralization    Standing Status:   Future    Number of Occurrences:   1    Standing Expiration Date:   07/08/2023   Cardiolipin antibodies, IgG, IgM, IgA    Standing Status:   Future    Number of Occurrences:   1    Standing Expiration Date:   07/08/2023   Antithrombin III    Standing Status:   Future    Number of Occurrences:   1    Standing Expiration Date:   07/08/2023  Protein C activity    Standing Status:   Future    Number of Occurrences:   1    Standing Expiration Date:   07/08/2023   Protein S activity    Standing Status:   Future    Number of Occurrences:   1    Standing Expiration Date:   07/08/2023   Beta-2-glycoprotein i abs, IgG/M/A    Standing Status:   Future    Number of Occurrences:   1    Standing Expiration Date:   07/07/2023     HISTORY OF PRESENTING ILLNESS:  James Burns 56 y.o. male is here because of PE.  This is a very pleasant 56 year old male patient with past medical history of melanoma on the back in 2014 status postresection followed by additional surgery and lymph node biopsy, most recently  presented with sudden onset right lower extremity swelling, fatigue and shortness of breath.  He was found to have a DVT of the right lower extremity in the Right popliteal, right posterior tibial and peroneal veins.  He then had a CT with PE protocol which showed a submassive PE.  He is now on Eliquis.  Since he started the blood thinner, his lower extremity swelling and shortness of breath have significantly improved.  He almost feels normal.  He never had a DVT/PE in the past.  He remembers his father who had a blood clot but this was in the setting of lymphoma. He denies any trauma to that leg.  He does right bike for long distances about 15 to 20 miles about 3 times a week but this was not a new thing to him.  He denies any long distance travel.  No surgery prior to the DVT.  No use of testosterone prior to the DVT. He tells me that his wife is a Marine scientist and she had a few questions today.  He was wondering how we can make sure that the clot has actually completely resolved and if there is any value for repeat imaging or CT scans.  Rest of the pertinent 10 point ROS reviewed and negative   MEDICAL HISTORY:  Past Medical History:  Diagnosis Date   Cancer ([place])    Melanoma   Medical history non-contributory    Wears glasses     SURGICAL HISTORY: Past Surgical History:  Procedure Laterality Date   MELANOMA EXCISION WITH SENTINEL LYMPH NODE BIOPSY Right 11/30/2012   Procedure: MELANOMA wide EXCISION right back  WITH SENTINEL LYMPH NODE BIOPSY nuclear medicine injection 7:00;  Surgeon: Zenovia Jarred, MD;  Location: [facility];  Service: General;  Laterality: Right;  melanoma nuclear medicine injection at 7:00 per Vivien Rota    Rotator cuff surgery Bilateral    SIGMOIDOSCOPY     TONSILLECTOMY     WISDOM TOOTH EXTRACTION      SOCIAL HISTORY: Social History   Socioeconomic History   Marital status: Married    Spouse name: Not on file   Number of children: Not on file   Years  of education: Not on file   Highest education level: Not on file  Occupational History   Not on file  Tobacco Use   Smoking status: Never   Smokeless tobacco: Not on file  Substance and Sexual Activity   Alcohol use: No   Drug use: No   Sexual activity: Not on file  Other Topics Concern   Not on file  Social History Narrative   Not on file   Social  Determinants of Health   Financial Resource Strain: Not on file  Food Insecurity: Not on file  Transportation Needs: Not on file  Physical Activity: Not on file  Stress: Not on file  Social Connections: Not on file  Intimate Partner Violence: Not At Risk (05/18/2022)   Humiliation, Afraid, Rape, and Kick questionnaire    Fear of Current or Ex-Partner: No    Emotionally Abused: No    Physically Abused: No    Sexually Abused: No    FAMILY HISTORY: History reviewed. No pertinent family history.  ALLERGIES:  has No Known Allergies.  MEDICATIONS:  Current Outpatient Medications  Medication Sig Dispense Refill   apixaban (ELIQUIS) 5 MG TABS tablet Take 1 tablet (5 mg total) by mouth 2 (two) times daily. 60 tablet 2   sildenafil (VIAGRA) 50 MG tablet Take 1 tablet (50 mg total) by mouth daily as needed for erectile dysfunction. At least 24 hours between doses. 10 tablet 1   No current facility-administered medications for this visit.     PHYSICAL EXAMINATION: ECOG PERFORMANCE STATUS: 0 - Asymptomatic  Vitals:   07/07/22 1408  BP: (!) 138/90  Pulse: 76  Resp: 16  Temp: 97.9 F (36.6 C)  SpO2: 98%   Filed Weights   07/07/22 1408  Weight: 189 lb 4 oz (85.8 kg)    Physical Exam Constitutional:      Appearance: Normal appearance.  Cardiovascular:     Rate and Rhythm: Normal rate and regular rhythm.  Pulmonary:     Effort: Pulmonary effort is normal.     Breath sounds: Normal breath sounds.  Musculoskeletal:        General: No swelling. Normal range of motion.     Cervical back: Normal range of motion and neck  supple. No rigidity.  Lymphadenopathy:     Cervical: No cervical adenopathy.  Skin:    Comments: Surgical incision on the back with no evidence of local recurrence  Neurological:     General: No focal deficit present.     Mental Status: He is alert.  Psychiatric:        Mood and Affect: Mood normal.      LABORATORY DATA:  I have reviewed the data as listed Lab Results  Component Value Date   WBC 5.8 05/18/2022   HGB 13.0 05/18/2022   HCT 41.0 05/18/2022   MCV 81.3 05/18/2022   PLT 249 05/18/2022     Chemistry      Component Value Date/Time   NA 138 05/17/2022 0301   NA 138 01/23/2013 1330   K 4.1 05/17/2022 0301   K 4.0 01/23/2013 1330   CL 105 05/17/2022 0301   CL 105 01/23/2013 1330   CO2 26 05/17/2022 0301   CO2 26 01/23/2013 1330   BUN 25 (H) 05/17/2022 0301   BUN 28.7 (H) 01/23/2013 1330   CREATININE 1.13 05/17/2022 0301   CREATININE 1.1 01/23/2013 1330      Component Value Date/Time   CALCIUM 9.5 05/17/2022 0301   CALCIUM 9.6 01/23/2013 1330   ALKPHOS 77 01/23/2013 1330   AST 25 01/23/2013 1330   ALT 29 01/23/2013 1330   BILITOT 0.49 01/23/2013 1330       RADIOGRAPHIC STUDIES: I have personally reviewed the radiological images as listed and agreed with the findings in the report. No results found.  All questions were answered. The patient knows to call the clinic with any problems, questions or concerns. I spent 45 minutes in the care of this  patient including H and P, review of records, counseling and coordination of care.     Benay Pike, MD 07/07/2022 4:46 PM

## 2022-07-07 NOTE — Assessment & Plan Note (Signed)
This is a very pleasant 56 year old male patient with melanoma now diagnosed with acute DVT on the right lower extremity and submassive PE now on anticoagulation with Eliquis referred to hematology for additional recommendations.  There appears to be no evidence of provoking factors prior to this DVT.  No significant family history of thromboembolic disorders, dad had blood clot but this was in the setting of lymphoma.  He does right long-distance bike 2-3 times a week about 15 to 20 miles and is training for a long bike ride.  He did significantly improve after initiating anticoagulation, no more lower extremity swelling or shortness of breath.  We have today discussed about considering hypercoagulable work-up since this is an unprovoked DVT.  We have also discussed that the typical duration of anticoagulation is about 6 months. Sometimes we do consider it in definitively if there is a persistent risk factor.  We discussed that there is no role for repeat imaging because this may not necessarily change our management.  We have discussed that some people take months to years to clear the clot.  As long as his symptoms continue to improve, he may be resolving the clot slowly.  He was wondering if he needs to see a pulmonologist.  This is certainly reasonable, he may have to talk to his PCP for consult. He also made a list of questions that his wife had.  We have discussed that there is no role for PT PTT testing while on Eliquis.  We have discussed about risks of anticoagulation including life-threatening bleeding.  He was agreeable to the hypercoagulable work-up.  He is working with our financial department to obtain Eliquis since he is self-pay he states.  He can return to clinic in 2 weeks for televisit to review the lab results and to discuss additional recommendations.  All his questions were answered to the best of my knowledge.  Thank you for consulting [place] the care of this patient.  Please do not hesitate  to contact us with any additional questions or concerns

## 2022-07-08 LAB — BETA-2-GLYCOPROTEIN I ABS, IGG/M/A
Beta-2 Glyco I IgG: <9 GPI IgG units (ref 0–20)
Beta-2-Glycoprotein I IgA: <9 GPI IgA units (ref 0–25)
Beta-2-Glycoprotein I IgM: <9 GPI IgM units (ref 0–32)

## 2022-07-08 LAB — CARDIOLIPIN ANTIBODIES, IGG, IGM, IGA
Anticardiolipin IgA: <9 APL U/mL (ref 0–11)
Anticardiolipin IgG: <9 GPL U/mL (ref 0–14)
Anticardiolipin IgM: <9 MPL U/mL (ref 0–12)

## 2022-07-09 LAB — PROTEIN S ACTIVITY: Protein S Activity: 78 % (ref 63–140)

## 2022-07-09 LAB — PROTEIN C ACTIVITY: Protein C Activity: 129 % (ref 73–180)

## 2022-07-09 LAB — LUPUS ANTICOAGULANT PANEL
DRVVT: 38.1 s (ref 0.0–47.0)
PTT Lupus Anticoagulant: 34.9 s (ref 0.0–43.5)

## 2022-07-11 LAB — HEXAGONAL PHOSPHOLIPID NEUTRALIZATION: Hexagonal Phospholipid Neutral: 8 s

## 2022-07-12 LAB — FACTOR 5 LEIDEN

## 2022-07-13 LAB — PROTHROMBIN GENE MUTATION

## 2022-07-22 ENCOUNTER — Inpatient Hospital Stay: Payer: Self-pay | Attending: Hematology and Oncology | Admitting: Hematology and Oncology

## 2022-07-22 ENCOUNTER — Encounter: Payer: Self-pay | Admitting: Hematology and Oncology

## 2022-07-22 DIAGNOSIS — C4359 Malignant melanoma of other part of trunk: Secondary | ICD-10-CM

## 2022-07-22 NOTE — Assessment & Plan Note (Signed)
This is a very pleasant 56 year old male patient with melanoma now diagnosed with acute DVT on the right lower extremity and submassive PE now on anticoagulation with Eliquis referred to hematology for additional recommendations. He is here for telephone visit to review hypercoagulable work-up.  We have discussed about the heterozygous prothrombin gene mutation.  Rest of the hypercoagulable work-up without any concerns.  We have discussed that prothrombin gene is inherited thrombophilia and since this is his first episode of DVT/PE, we do not necessarily recommend indefinite anticoagulation.  In patients with a prothrombin gene heterozygous mutation and one episode of DVT/PE, in the absence of other risk factors, 3 to 6 months of anticoagulation is reasonable.  He is very active at work in general and tries to hydrate regularly, exercises regularly. I have discussed about genetic testing in his daughter since concomitant birth control can increase the risk of blood clots. We have discussed about risk factors for DVT/PE.  We have discussed about symptoms and signs of DVT/PE.  If he were to have another episode of DVT or PE, he may then need indefinite anticoagulation. He is agreeable to all these recommendations.  He will now continue Eliquis for up to 6 months.  In the past we have discussed about adverse effects of blood thinners. He will return to clinic as needed.

## 2022-07-22 NOTE — Progress Notes (Signed)
[place] CONSULT NOTE  Patient Care Team: London Pepper, MD as PCP - General (Family Medicine)  CHIEF COMPLAINTS/PURPOSE OF CONSULTATION:  PE  ASSESSMENT & PLAN:  Malignant melanoma of back This is a very pleasant 56 year old male patient with melanoma now diagnosed with acute DVT on the right lower extremity and submassive PE now on anticoagulation with Eliquis referred to hematology for additional recommendations. He is here for telephone visit to review hypercoagulable work-up.  We have discussed about the heterozygous prothrombin gene mutation.  Rest of the hypercoagulable work-up without any concerns.  We have discussed that prothrombin gene is inherited thrombophilia and since this is his first episode of DVT/PE, we do not necessarily recommend indefinite anticoagulation.  In patients with a prothrombin gene heterozygous mutation and one episode of DVT/PE, in the absence of other risk factors, 3 to 6 months of anticoagulation is reasonable.  He is very active at work in general and tries to hydrate regularly, exercises regularly. I have discussed about genetic testing in his daughter since concomitant birth control can increase the risk of blood clots. We have discussed about risk factors for DVT/PE.  We have discussed about symptoms and signs of DVT/PE.  If he were to have another episode of DVT or PE, he may then need indefinite anticoagulation. He is agreeable to all these recommendations.  He will now continue Eliquis for up to 6 months.  In the past we have discussed about adverse effects of blood thinners. He will return to clinic as needed.   HISTORY OF PRESENTING ILLNESS:  James Burns 56 y.o. male is here because of PE.  This is a very pleasant 56 year old male patient with past medical history of melanoma on the back in 2014 status postresection followed by additional surgery and lymph node biopsy, most recently presented with sudden onset right lower  extremity swelling, fatigue and shortness of breath.  He was found to have a DVT of the right lower extremity in the Right popliteal, right posterior tibial and peroneal veins.  He then had a CT with PE protocol which showed a submassive PE.    Interval history He is here for telephone visit.  Since his last visit, he continues on Eliquis.  He denies any leg swelling or shortness of breath.  He feels normal. Rest of the pertinent 10 point ROS reviewed and negative   MEDICAL HISTORY:  Past Medical History:  Diagnosis Date   Cancer ([place])    Melanoma   Medical history non-contributory    Wears glasses     SURGICAL HISTORY: Past Surgical History:  Procedure Laterality Date   MELANOMA EXCISION WITH SENTINEL LYMPH NODE BIOPSY Right 11/30/2012   Procedure: MELANOMA wide EXCISION right back  WITH SENTINEL LYMPH NODE BIOPSY nuclear medicine injection 7:00;  Surgeon: Zenovia Jarred, MD;  Location: [facility];  Service: General;  Laterality: Right;  melanoma nuclear medicine injection at 7:00 per Vivien Rota    Rotator cuff surgery Bilateral    SIGMOIDOSCOPY     TONSILLECTOMY     WISDOM TOOTH EXTRACTION      SOCIAL HISTORY: Social History   Socioeconomic History   Marital status: Married    Spouse name: Not on file   Number of children: Not on file   Years of education: Not on file   Highest education level: Not on file  Occupational History   Not on file  Tobacco Use   Smoking status: Never   Smokeless tobacco: Not on  file  Substance and Sexual Activity   Alcohol use: No   Drug use: No   Sexual activity: Not on file  Other Topics Concern   Not on file  Social History Narrative   Not on file   Social Determinants of Health   Financial Resource Strain: Not on file  Food Insecurity: Not on file  Transportation Needs: Not on file  Physical Activity: Not on file  Stress: Not on file  Social Connections: Not on file  Intimate Partner Violence: Not At Risk  (05/18/2022)   Humiliation, Afraid, Rape, and Kick questionnaire    Fear of Current or Ex-Partner: No    Emotionally Abused: No    Physically Abused: No    Sexually Abused: No    FAMILY HISTORY: No family history on file.  ALLERGIES:  has No Known Allergies.  MEDICATIONS:  Current Outpatient Medications  Medication Sig Dispense Refill   apixaban (ELIQUIS) 5 MG TABS tablet Take 1 tablet (5 mg total) by mouth 2 (two) times daily. 60 tablet 2   sildenafil (VIAGRA) 50 MG tablet Take 1 tablet (50 mg total) by mouth daily as needed for erectile dysfunction. At least 24 hours between doses. 10 tablet 1   No current facility-administered medications for this visit.     PHYSICAL EXAMINATION: ECOG PERFORMANCE STATUS: 0 - Asymptomatic  There were no vitals filed for this visit.  There were no vitals filed for this visit.   Physical examination not done, telephone visit   LABORATORY DATA:  I have reviewed the data as listed Lab Results  Component Value Date   WBC 5.8 05/18/2022   HGB 13.0 05/18/2022   HCT 41.0 05/18/2022   MCV 81.3 05/18/2022   PLT 249 05/18/2022     Chemistry      Component Value Date/Time   NA 138 05/17/2022 0301   NA 138 01/23/2013 1330   K 4.1 05/17/2022 0301   K 4.0 01/23/2013 1330   CL 105 05/17/2022 0301   CL 105 01/23/2013 1330   CO2 26 05/17/2022 0301   CO2 26 01/23/2013 1330   BUN 25 (H) 05/17/2022 0301   BUN 28.7 (H) 01/23/2013 1330   CREATININE 1.13 05/17/2022 0301   CREATININE 1.1 01/23/2013 1330      Component Value Date/Time   CALCIUM 9.5 05/17/2022 0301   CALCIUM 9.6 01/23/2013 1330   ALKPHOS 77 01/23/2013 1330   AST 25 01/23/2013 1330   ALT 29 01/23/2013 1330   BILITOT 0.49 01/23/2013 1330     I connected with  Tamala Julian on 07/22/22 by a telephone application and verified that I am speaking with the correct person using two identifiers.   I discussed the limitations of evaluation and management by telemedicine. The  patient expressed understanding and agreed to proceed.   RADIOGRAPHIC STUDIES: I have personally reviewed the radiological images as listed and agreed with the findings in the report. No results found.  All questions were answered. The patient knows to call the clinic with any problems, questions or concerns. I spent 12 minutes in the care of this patient including History, review of records, counseling and coordination of care.     Benay Pike, MD 07/22/2022 9:41 AM

## 2022-07-28 ENCOUNTER — Encounter: Payer: Self-pay | Admitting: Hematology and Oncology

## 2022-08-04 ENCOUNTER — Other Ambulatory Visit: Payer: Self-pay

## 2022-08-09 ENCOUNTER — Encounter: Payer: Self-pay | Admitting: Family Medicine

## 2022-08-09 ENCOUNTER — Ambulatory Visit: Payer: Self-pay | Attending: Family Medicine | Admitting: Family Medicine

## 2022-08-09 VITALS — BP 133/81 | HR 72 | Temp 98.4°F | Ht 72.0 in | Wt 188.0 lb

## 2022-08-09 DIAGNOSIS — I82431 Acute embolism and thrombosis of right popliteal vein: Secondary | ICD-10-CM

## 2022-08-09 DIAGNOSIS — I2699 Other pulmonary embolism without acute cor pulmonale: Secondary | ICD-10-CM

## 2022-08-09 NOTE — Progress Notes (Signed)
Subjective:  Patient ID: James Burns, male    DOB: 01-May-1966  Age: 56 y.o. MRN: [id number]  CC: pulmonary embolism   HPI James Burns is a 56 y.o. year old male with a history of malignant melanoma (status post excision in 2014), acute pulmonary edema, acute DVT during hospitalization from 05/17/2022 through 05/18/2022 after he presented with dyspnea and right leg pain. Of note he had undertaken a long trip to [place] but had taken about 2 rest stops during his trip.  Interval History: He has been on anticoagulation with Eliquis since 06/08/2022 and was recently seen by hematology 3 weeks ago, hypercoagulable work-up was normal except for heterozygous prothrombin gene mutation with recommendation to continue anticoagulation until 6 months which will take him to 12/06/2021. He follows up in 6 months with hematology.  He still has slight dyspnea when going up stairs but he is fine at rest.  He has no pain in his legs.  Denies presence of bleeding or bruising. He is concerned he may not be able to afford Eliquis as he is no longer approved for the medication assistance through the drug company.  Past Medical History:  Diagnosis Date   Cancer ([place])    Melanoma   Medical history non-contributory    Wears glasses     Past Surgical History:  Procedure Laterality Date   MELANOMA EXCISION WITH SENTINEL LYMPH NODE BIOPSY Right 11/30/2012   Procedure: MELANOMA wide EXCISION right back  WITH SENTINEL LYMPH NODE BIOPSY nuclear medicine injection 7:00;  Surgeon: Zenovia Jarred, MD;  Location: [facility];  Service: General;  Laterality: Right;  melanoma nuclear medicine injection at 7:00 per Toni    Rotator cuff surgery Bilateral    SIGMOIDOSCOPY     TONSILLECTOMY     WISDOM TOOTH EXTRACTION      No family history on file.  Social History   Socioeconomic History   Marital status: Married    Spouse name: Not on file   Number of children: Not on file   Years of  education: Not on file   Highest education level: Not on file  Occupational History   Not on file  Tobacco Use   Smoking status: Never   Smokeless tobacco: Not on file  Substance and Sexual Activity   Alcohol use: No   Drug use: No   Sexual activity: Not on file  Other Topics Concern   Not on file  Social History Narrative   Not on file   Social Determinants of Health   Financial Resource Strain: Not on file  Food Insecurity: Not on file  Transportation Needs: Not on file  Physical Activity: Not on file  Stress: Not on file  Social Connections: Not on file    No Known Allergies  Outpatient Medications Prior to Visit  Medication Sig Dispense Refill   apixaban (ELIQUIS) 5 MG TABS tablet Take 1 tablet (5 mg total) by mouth 2 (two) times daily. 60 tablet 2   sildenafil (VIAGRA) 50 MG tablet Take 1 tablet (50 mg total) by mouth daily as needed for erectile dysfunction. At least 24 hours between doses. 10 tablet 1   No facility-administered medications prior to visit.     ROS Review of Systems  Constitutional:  Negative for activity change and appetite change.  HENT:  Negative for sinus pressure and sore throat.   Respiratory:  Positive for shortness of breath. Negative for chest tightness and wheezing.   Cardiovascular:  Negative for chest  pain and palpitations.  Gastrointestinal:  Negative for abdominal distention, abdominal pain and constipation.  Genitourinary: Negative.   Musculoskeletal: Negative.   Psychiatric/Behavioral:  Negative for behavioral problems and dysphoric mood.     Objective:  BP 133/81   Pulse 72   Temp 98.4 F (36.9 C) (Oral)   Ht 6' (1.829 m)   Wt 188 lb (85.3 kg)   SpO2 99%   BMI 25.50 kg/m      08/09/2022    2:11 PM 07/07/2022    2:08 PM 06/08/2022    2:27 PM  BP/Weight  Systolic BP 242 353 614  Diastolic BP 81 90 80  Wt. (Lbs) 188 189.25 189.8  BMI 25.5 kg/m2 25.67 kg/m2 25.74 kg/m2      Physical Exam Constitutional:       Appearance: He is well-developed.  Cardiovascular:     Rate and Rhythm: Normal rate.     Heart sounds: Normal heart sounds. No murmur heard. Pulmonary:     Effort: Pulmonary effort is normal.     Breath sounds: Normal breath sounds. No wheezing or rales.  Chest:     Chest wall: No tenderness.  Abdominal:     General: Bowel sounds are normal. There is no distension.     Palpations: Abdomen is soft. There is no mass.     Tenderness: There is no abdominal tenderness.  Musculoskeletal:        General: Normal range of motion.     Right lower leg: No edema.     Left lower leg: No edema.  Neurological:     Mental Status: He is alert and oriented to person, place, and time.  Psychiatric:        Mood and Affect: Mood normal.        Latest Ref Rng & Units 05/17/2022    3:01 AM 01/23/2013    1:30 PM  CMP  Glucose 70 - 99 mg/dL 111  100   BUN 6 - 20 mg/dL 25  28.7   Creatinine 0.61 - 1.24 mg/dL 1.13  1.1   Sodium 135 - 145 mmol/L 138  138   Potassium 3.5 - 5.1 mmol/L 4.1  4.0   Chloride 98 - 111 mmol/L 105  105   CO2 22 - 32 mmol/L 26  26   Calcium 8.9 - 10.3 mg/dL 9.5  9.6   Total Protein 6.4 - 8.3 g/dL  7.3   Total Bilirubin 0.20 - 1.20 mg/dL  0.49   Alkaline Phos 40 - 150 U/L  77   AST 5 - 34 U/L  25   ALT 0 - 55 U/L  29     Lipid Panel  No results found for: "CHOL", "TRIG", "HDL", "CHOLHDL", "VLDL", "LDLCALC", "LDLDIRECT"  CBC    Component Value Date/Time   WBC 5.8 05/18/2022 0456   RBC 5.04 05/18/2022 0456   HGB 13.0 05/18/2022 0456   HGB 14.5 01/23/2013 1330   HCT 41.0 05/18/2022 0456   HCT 43.7 01/23/2013 1330   PLT 249 05/18/2022 0456   PLT 211 01/23/2013 1330   MCV 81.3 05/18/2022 0456   MCV 83.4 01/23/2013 1330   MCH 25.8 (L) 05/18/2022 0456   MCHC 31.7 05/18/2022 0456   RDW 16.8 (H) 05/18/2022 0456   RDW 14.0 01/23/2013 1330   LYMPHSABS 1.7 05/17/2022 0301   LYMPHSABS 1.5 01/23/2013 1330   MONOABS 1.0 05/17/2022 0301   MONOABS 0.6 01/23/2013 1330    EOSABS 0.2 05/17/2022 0301   EOSABS 0.1  01/23/2013 1330   BASOSABS 0.0 05/17/2022 0301   BASOSABS 0.0 01/23/2013 1330    No results found for: "HGBA1C"  Assessment & Plan:  1. Acute pulmonary embolism without acute cor pulmonale, unspecified pulmonary embolism type [facility]) He has been on anticoagulation with Eliquis since 06/08/2022 Per hematology note he will remain on 6 months of anticoagulation until 12/07/2022 Discussed option of warfarin if he is unable to afford Eliquis and cannot obtain it through the medication assistance program.  Educated on role of regular INR checks. Advised that if he is down to 1 week of his pills of Eliquis he can send me a MyChart message so prescription for morphine will be sent to pharmacy  2. Acute deep vein thrombosis (DVT) of popliteal vein of right lower extremity ([place]) See #1 above    No orders of the defined types were placed in this encounter.   Follow-up: Return in about 4 months (around 12/08/2022) for Coordination of care.Charlott Rakes, MD, FAAFP. [facility] and [place], [place]   08/09/2022, 4:44 PM

## 2022-08-09 NOTE — Patient Instructions (Signed)
Warfarin Tablets What is this medication? WARFARIN (WAR far in) prevents and treats blood clots. It may also be used to lower the risk of stroke in people with AFib (atrial fibrillation) or heart valve replacement. It belongs to a group of medications called blood thinners. This medicine may be used for other purposes; ask your health care provider or pharmacist if you have questions. COMMON BRAND NAME(S): Coumadin, Jantoven What should I tell my care team before I take this medication? They need to know if you have any of these conditions: Alcohol use disorder Anemia Bleeding disorders Cancer Diabetes Heart disease High blood pressure History of bleeding in the gastrointestinal tract History of stroke or other brain injury or disease Kidney or liver disease Protein C deficiency Protein S deficiency Psychosis or dementia Recent injury, recent or planned surgery or procedure An unusual or allergic reaction to warfarin, other medications, foods, dyes, or preservatives Pregnant or trying to get pregnant Breast-feeding How should I use this medication? Take this medication by mouth with a glass of water. Follow the directions on the prescription label. You can take this medication with or without food. Take your medication at the same time each day. Do not take it more often than directed. Do not stop taking except on your care team's advice. Stopping this medication may increase your risk of a blood clot. Be sure to refill your prescription before you run out of medication. If your care team calls to change your dose, write down the dose and any other instructions. Always read the dose and instructions back to him or her to make sure you understand them. Tell your care team what strength of tablets you have on hand. Ask how many tablets you should take to equal your new dose. Write the date on the new instructions and keep them near your medication. If you are told to stop taking your  medication until your next blood test, call your care team if you do not hear anything within 24 hours of the test to find out your new dose or when to restart your prior dose. A special MedGuide will be given to you by the pharmacist with each prescription and refill. Be sure to read this information carefully each time. Talk to your care team about the use of this medication in children. Special care may be needed. Overdosage: If you think you have taken too much of this medicine contact a poison control center or emergency room at once. NOTE: This medicine is only for you. Do not share this medicine with others. What if I miss a dose? It is important not to miss a dose. If you miss a dose, call your care team. Take the dose as soon as possible on the same day. If it is almost time for your next dose, take only that dose. Do not take double or extra doses to make up for a missed dose. What may interact with this medication? Do not take this medication with any of the following: Defibrotide This medication may also interact with the following: Acyclovir Allopurinol Aprepitant Armodafinil Aspirin Bicalutamide Bosentan Caffeine Capecitabine Certain antibiotics, such as erythromycin, clarithromycin, ciprofloxacin, cotrimoxazole, metronidazole, norfloxacin, or tigecycline Certain antivirals for HIV or hepatitis Certain medications for blood clots, such as argatroban, aspirin, bivalirudin, enoxaparin, fondaparinux, heparin, or lepirudin Certain medications for blood pressure, heart disease, irregular heartbeat Certain medications for cholesterol, such as atorvastatin, lovastatin, and simvastatin Certain medications for fungal infections, such as fluconazole, ketoconazole, itraconazole, posaconazole, or voriconazole Certain  medications for mental health conditions Certain medications for seizures, such as carbamazepine, phenobarbital, phenytoin,  rufinamide Cilostazol Clopidogrel Conivaptan Cyclosporine Dipyridamole Disulfiram Estrogen or progestin hormones Herbal or dietary products, such as garlic, ginkgo, ginseng, green tea, kava kava, red yeast rice, St. John's Wort Isoniazid Methoxsalen Modafinil Nilotinib NSAIDs, medications for pain and inflammation, such as ibuprofen or naproxen Oxandrolone Phenylpropanolamine Prasugrel Rifampin Steroid medications, such as prednisone or cortisone Stomach acid blockers, such as cimetidine, famotidine, ranitidine, or omeprazole Sulfinpyrazone Thiabendazole Ticlopidine Vitamin K Zafirlukast Zileuton This list may not describe all possible interactions. Give your health care provider a list of all the medicines, herbs, non-prescription drugs, or dietary supplements you use. Also tell them if you smoke, drink alcohol, or use illegal drugs. Some items may interact with your medicine. What should I watch for while using this medication? Visit your care team for regular checks on your progress. You will need to have a blood test called a PT/INR regularly. The PT/INR blood test is done to make sure you are getting the right dose of this medication. It is important to not miss your appointment for the blood tests. When you first start taking this medication, these tests are done often. Once the correct dose is determined and you take your medication properly, these tests can be done less often. Wear a medical ID bracelet or chain, and carry a card that describes your disease and details of your medication and dosage times. Do not start taking or stop taking any medications or over-the-counter medications except on the advice of your care team. You should discuss your diet with your care team. Do not make major changes in your diet. Vitamin K can affect how well this medication works. Many foods contain vitamin K. It is important to eat a consistent amount of foods with vitamin K. Other foods  with vitamin K that you should eat in consistent amounts are asparagus, basil, black-eyed peas, broccoli, brussel sprouts, cabbage, green onions, green tea, parsley, green leafy vegetables like beet greens, collard greens, kale, spinach, turnip greens, or certain lettuces like green leaf or romaine. Talk to your care team if you wish to become pregnant or think you might be pregnant. This medication can cause bleeding and serious birth defects. Avoid sports and activities that might cause injury while you are using this medication. Severe falls or injuries can cause unseen bleeding. Be careful when using sharp tools or knives. Consider using an Copy. Take special care brushing or flossing your teeth. Report any injuries, bruising, or red spots on the skin to your care team. If you have an illness that causes vomiting, diarrhea, or fever for more than a few days, contact your care team. Also, check with your care team if you are unable to eat for several days. These problems can change the effect of this medication. Even after you stop taking this medication, it takes several days before your body recovers its normal ability to clot blood. Ask your care team how long you need to be careful. If you are going to have surgery or dental work, tell your care team that you have been taking this medication. What side effects may I notice from receiving this medication? Side effects that you should report to your care team as soon as possible: Allergic reactions--skin rash, itching, hives, swelling of the face, lips, tongue, or throat Bleeding--bloody or black, tar-like stools, vomiting blood or brown material that looks like coffee grounds, red or dark brown urine, small red  or purple spots on skin, unusual bruising or bleeding Bleeding in the brain--severe headache, stiff neck, confusion, dizziness, change in vision, numbness or weakness of the face, arm or leg, trouble speaking, trouble walking,  vomiting Dark or purple painful toes Heavy periods Painful swelling, warmth, or redness of the skin, blisters or sores Side effects that usually do not require medical attention (report to your care team if they continue or are bothersome): Diarrhea Hair loss This list may not describe all possible side effects. Call your doctor for medical advice about side effects. You may report side effects to FDA at 1-800-FDA-1088. Where should I keep my medication? Keep out of the reach of children and pets. Store at room temperature between 15 and 30 degrees C (59 and 86 degrees F). Protect from light. Throw away any unused medication after the expiration date. Do not flush down the toilet. NOTE: This sheet is a summary. It may not cover all possible information. If you have questions about this medicine, talk to your doctor, pharmacist, or health care provider.  2023 Elsevier/Gold Standard (2020-10-30 00:00:00)

## 2022-08-11 ENCOUNTER — Encounter: Payer: Self-pay | Admitting: Hematology and Oncology

## 2022-08-18 ENCOUNTER — Other Ambulatory Visit: Payer: Self-pay | Admitting: *Deleted

## 2022-08-18 ENCOUNTER — Telehealth: Payer: Self-pay | Admitting: *Deleted

## 2022-08-23 NOTE — Telephone Encounter (Signed)
Opened in error

## 2022-09-07 ENCOUNTER — Other Ambulatory Visit (HOSPITAL_BASED_OUTPATIENT_CLINIC_OR_DEPARTMENT_OTHER): Payer: Self-pay

## 2022-09-07 ENCOUNTER — Other Ambulatory Visit: Payer: Self-pay

## 2022-09-08 ENCOUNTER — Other Ambulatory Visit: Payer: Self-pay

## 2022-09-09 ENCOUNTER — Other Ambulatory Visit: Payer: Self-pay

## 2022-10-10 ENCOUNTER — Other Ambulatory Visit (HOSPITAL_BASED_OUTPATIENT_CLINIC_OR_DEPARTMENT_OTHER): Payer: Self-pay

## 2022-10-10 ENCOUNTER — Other Ambulatory Visit: Payer: Self-pay | Admitting: Family Medicine

## 2022-10-10 ENCOUNTER — Other Ambulatory Visit (HOSPITAL_COMMUNITY): Payer: Self-pay

## 2022-10-10 DIAGNOSIS — I82431 Acute embolism and thrombosis of right popliteal vein: Secondary | ICD-10-CM

## 2022-10-10 DIAGNOSIS — I2699 Other pulmonary embolism without acute cor pulmonale: Secondary | ICD-10-CM

## 2022-10-10 NOTE — Telephone Encounter (Signed)
Pt is calling to follow up on the medication refill. Pt stated he only has medication left for tomorrow.   Please advise.

## 2022-10-11 ENCOUNTER — Telehealth: Payer: Self-pay

## 2022-10-11 ENCOUNTER — Other Ambulatory Visit: Payer: Self-pay

## 2022-10-11 ENCOUNTER — Other Ambulatory Visit: Payer: Self-pay | Admitting: Family Medicine

## 2022-10-11 ENCOUNTER — Other Ambulatory Visit (HOSPITAL_BASED_OUTPATIENT_CLINIC_OR_DEPARTMENT_OTHER): Payer: Self-pay

## 2022-10-11 DIAGNOSIS — I2699 Other pulmonary embolism without acute cor pulmonale: Secondary | ICD-10-CM

## 2022-10-11 DIAGNOSIS — I82431 Acute embolism and thrombosis of right popliteal vein: Secondary | ICD-10-CM

## 2022-10-11 MED ORDER — APIXABAN 5 MG PO TABS
5.0000 mg | ORAL_TABLET | Freq: Two times a day (BID) | ORAL | 3 refills | Status: DC
  Filled 2022-10-11: qty 60, 30d supply, fill #0
  Filled 2022-10-12: qty 120, 60d supply, fill #0
  Filled 2022-11-21 – 2022-11-22 (×2): qty 120, 60d supply, fill #1

## 2022-10-11 NOTE — Addendum Note (Signed)
Addended by: Evelena Asa F on: 10/11/2022 12:12 PM   Modules accepted: Orders

## 2022-10-11 NOTE — Telephone Encounter (Signed)
The patient called in stating the [facility] will not apply a discount and that it is way too expensive for self pay patients. He has contacted   [place], [place] Phone: [phone]  Fax: [phone]      And they said they would apply the discount if his provider could transfer the prescription to them as soon as possible. Please assist patient further

## 2022-10-11 NOTE — Telephone Encounter (Signed)
This patient was denied patient assistance for his Eliquis through [employer] due to his income exceeding eligibility criteria limit. He paid out-of-pocket for it last month but can't afford to keep doing that as it is over $500. He states he is needing to continue this therapy for 2 months-I have 2 months of overstock I can give him if that is appropriate. After the 2 months his therapy may need to be changed.

## 2022-10-11 NOTE — Telephone Encounter (Signed)
Requested medications are due for refill today.  yes  Requested medications are on the active medications list.  yes  Last refill. 06/08/2022 #60 2 rf  Future visit scheduled.   yes  Notes to clinic.  Missing labs.  [place] Pepper listed as PCP.    Requested Prescriptions  Pending Prescriptions Disp Refills   apixaban (ELIQUIS) 5 MG TABS tablet 60 tablet 2    Sig: Take 1 tablet (5 mg total) by mouth 2 (two) times daily.     Hematology:  Anticoagulants - apixaban Failed - 10/10/2022  1:50 PM      Failed - AST in normal range and within 360 days    AST  Date Value Ref Range Status  01/23/2013 25 5 - 34 U/L Final         Failed - ALT in normal range and within 360 days    ALT  Date Value Ref Range Status  01/23/2013 29 0 - 55 U/L Final         Passed - PLT in normal range and within 360 days    Platelets  Date Value Ref Range Status  05/18/2022 249 150 - 400 K/uL Final  01/23/2013 211 140 - 400 10e3/uL Final         Passed - HGB in normal range and within 360 days    Hemoglobin  Date Value Ref Range Status  05/18/2022 13.0 13.0 - 17.0 g/dL Final   HGB  Date Value Ref Range Status  01/23/2013 14.5 13.0 - 17.1 g/dL Final         Passed - HCT in normal range and within 360 days    HCT  Date Value Ref Range Status  05/18/2022 41.0 39.0 - 52.0 % Final  01/23/2013 43.7 38.4 - 49.9 % Final         Passed - Cr in normal range and within 360 days    Creatinine  Date Value Ref Range Status  01/23/2013 1.1 0.7 - 1.3 mg/dL Final   Creatinine, Ser  Date Value Ref Range Status  05/17/2022 1.13 0.61 - 1.24 mg/dL Final         Passed - Valid encounter within last 12 months    Recent Outpatient Visits           2 months ago Acute pulmonary embolism without acute cor pulmonale, unspecified pulmonary embolism type [facility])   [place], Charlane Ferretti, MD   4 months ago Acute pulmonary embolism without acute cor pulmonale, unspecified  pulmonary embolism type [facility])   [place], Enobong, MD       Future Appointments             In 1 month Charlott Rakes, MD [place]

## 2022-10-11 NOTE — Telephone Encounter (Signed)
Requested medication (s) are due for refill today: Yes  Requested medication (s) are on the active medication list:Yes  Last refill:  10/11/22  Future visit scheduled: Yes  Notes to clinic:  Unable to refill per protocol due to failed labs, no updated results. Resend to another pharmacy due to cost.     Requested Prescriptions  Pending Prescriptions Disp Refills   apixaban (ELIQUIS) 5 MG TABS tablet 60 tablet 3    Sig: Take 1 tablet (5 mg total) by mouth 2 (two) times daily.     Hematology:  Anticoagulants - apixaban Failed - 10/11/2022  2:42 PM      Failed - AST in normal range and within 360 days    AST  Date Value Ref Range Status  01/23/2013 25 5 - 34 U/L Final         Failed - ALT in normal range and within 360 days    ALT  Date Value Ref Range Status  01/23/2013 29 0 - 55 U/L Final         Passed - PLT in normal range and within 360 days    Platelets  Date Value Ref Range Status  05/18/2022 249 150 - 400 K/uL Final  01/23/2013 211 140 - 400 10e3/uL Final         Passed - HGB in normal range and within 360 days    Hemoglobin  Date Value Ref Range Status  05/18/2022 13.0 13.0 - 17.0 g/dL Final   HGB  Date Value Ref Range Status  01/23/2013 14.5 13.0 - 17.1 g/dL Final         Passed - HCT in normal range and within 360 days    HCT  Date Value Ref Range Status  05/18/2022 41.0 39.0 - 52.0 % Final  01/23/2013 43.7 38.4 - 49.9 % Final         Passed - Cr in normal range and within 360 days    Creatinine  Date Value Ref Range Status  01/23/2013 1.1 0.7 - 1.3 mg/dL Final   Creatinine, Ser  Date Value Ref Range Status  05/17/2022 1.13 0.61 - 1.24 mg/dL Final         Passed - Valid encounter within last 12 months    Recent Outpatient Visits           2 months ago Acute pulmonary embolism without acute cor pulmonale, unspecified pulmonary embolism type [facility])   [place], Charlane Ferretti, MD   4 months ago Acute  pulmonary embolism without acute cor pulmonale, unspecified pulmonary embolism type [facility])   [place], Enobong, MD       Future Appointments             In 1 month Charlott Rakes, MD [place]

## 2022-10-12 ENCOUNTER — Other Ambulatory Visit: Payer: Self-pay

## 2022-10-12 NOTE — Telephone Encounter (Signed)
Yes, he needs to be on anticoagulation until 12/07/2022.  If you could give him the 33-monthsupply that would be great.  Thank you

## 2022-10-13 ENCOUNTER — Other Ambulatory Visit (HOSPITAL_BASED_OUTPATIENT_CLINIC_OR_DEPARTMENT_OTHER): Payer: Self-pay

## 2022-10-13 ENCOUNTER — Other Ambulatory Visit: Payer: Self-pay

## 2022-11-21 ENCOUNTER — Other Ambulatory Visit: Payer: Self-pay

## 2022-11-22 ENCOUNTER — Other Ambulatory Visit (HOSPITAL_COMMUNITY): Payer: Self-pay

## 2022-11-22 ENCOUNTER — Other Ambulatory Visit: Payer: Self-pay

## 2022-11-25 ENCOUNTER — Encounter: Payer: Self-pay | Admitting: Family Medicine

## 2022-12-07 ENCOUNTER — Other Ambulatory Visit: Payer: Self-pay

## 2022-12-07 ENCOUNTER — Ambulatory Visit: Payer: Self-pay | Attending: Family Medicine | Admitting: Family Medicine

## 2022-12-07 VITALS — BP 156/81 | HR 77 | Temp 98.0°F | Ht 72.0 in | Wt 189.0 lb

## 2022-12-07 DIAGNOSIS — Z86711 Personal history of pulmonary embolism: Secondary | ICD-10-CM

## 2022-12-07 DIAGNOSIS — Z13228 Encounter for screening for other metabolic disorders: Secondary | ICD-10-CM

## 2022-12-07 DIAGNOSIS — Z1159 Encounter for screening for other viral diseases: Secondary | ICD-10-CM

## 2022-12-07 NOTE — Progress Notes (Signed)
Subjective:  Patient ID: James Burns, male    DOB: 10-01-1965  Age: 57 y.o. MRN: [id number]  CC: PULMONARY EMBOLISM   HPI James Burns is a 57 y.o. year old male with a history of malignant melanoma (status post excision in 2014), acute pulmonary edema, acute DVT during hospitalization from 05/17/2022 through 05/18/2022 after he presented with dyspnea and right leg pain. Of note he had undertaken a long trip to [place] but had taken about 2 rest stops during his trip.    Interval History:  He has been on anticoagulation with Eliquis since 05/2022 and has completed 6 months of anticoagulation.  Notes from hematology had recommended 6 months of anticoagulation.  Hypercoagulable workup revealed heterozygous prothrombin gene mutation.  He would like to have a CT scan to ensure the PE has completely resolved. He is asymptomatic. Past Medical History:  Diagnosis Date   Cancer ([place])    Melanoma   Medical history non-contributory    Wears glasses     Past Surgical History:  Procedure Laterality Date   MELANOMA EXCISION WITH SENTINEL LYMPH NODE BIOPSY Right 11/30/2012   Procedure: MELANOMA wide EXCISION right back  WITH SENTINEL LYMPH NODE BIOPSY nuclear medicine injection 7:00;  Surgeon: Zenovia Jarred, MD;  Location: [place] [facility];  Service: General;  Laterality: Right;  melanoma nuclear medicine injection at 7:00 per Toni    Rotator cuff surgery Bilateral    SIGMOIDOSCOPY     TONSILLECTOMY     WISDOM TOOTH EXTRACTION      No family history on file.  Social History   Socioeconomic History   Marital status: Married    Spouse name: Not on file   Number of children: Not on file   Years of education: Not on file   Highest education level: Associate degree: occupational, Hotel manager, or vocational program  Occupational History   Not on file  Tobacco Use   Smoking status: Never   Smokeless tobacco: Not on file  Substance and Sexual Activity   Alcohol use: No    Drug use: No   Sexual activity: Not on file  Other Topics Concern   Not on file  Social History Narrative   Not on file   Social Determinants of Health   Financial Resource Strain: Low Risk  (12/07/2022)   Overall Financial Resource Strain (CARDIA)    Difficulty of Paying Living Expenses: Not hard at all  Food Insecurity: No Food Insecurity (12/07/2022)   Hunger Vital Sign    Worried About Running Out of Food in the Last Year: Never true    [place] in the Last Year: Never true  Transportation Needs: No Transportation Needs (12/07/2022)   PRAPARE - Hydrologist (Medical): No    Lack of Transportation (Non-Medical): No  Physical Activity: Sufficiently Active (12/07/2022)   Exercise Vital Sign    Days of Exercise per Week: 7 days    Minutes of Exercise per Session: 30 min  Stress: No Stress Concern Present (12/07/2022)   [place]    Feeling of Stress : Not at all  Social Connections: Moderately Integrated (12/07/2022)   Social Connection and Isolation Panel [NHANES]    Frequency of Communication with Friends and Family: More than three times a week    Frequency of Social Gatherings with Friends and Family: More than three times a week    Attends Religious Services: More than 4 times  per year    Active Member of Clubs or Organizations: No    Attends Archivist Meetings: Not on file    Marital Status: Married    No Known Allergies  Outpatient Medications Prior to Visit  Medication Sig Dispense Refill   apixaban (ELIQUIS) 5 MG TABS tablet Take 1 tablet (5 mg total) by mouth 2 (two) times daily. 60 tablet 3   sildenafil (VIAGRA) 50 MG tablet Take 1 tablet (50 mg total) by mouth daily as needed for erectile dysfunction. At least 24 hours between doses. 10 tablet 1   No facility-administered medications prior to visit.     ROS Review of Systems  Constitutional:   Negative for activity change and appetite change.  HENT:  Negative for sinus pressure and sore throat.   Respiratory:  Negative for chest tightness, shortness of breath and wheezing.   Cardiovascular:  Negative for chest pain and palpitations.  Gastrointestinal:  Negative for abdominal distention, abdominal pain and constipation.  Genitourinary: Negative.   Musculoskeletal: Negative.   Psychiatric/Behavioral:  Negative for behavioral problems and dysphoric mood.     Objective:  BP (!) 156/81   Pulse 77   Temp 98 F (36.7 C) (Oral)   Ht 6' (1.829 m)   Wt 189 lb (85.7 kg)   SpO2 99%   BMI 25.63 kg/m      12/07/2022    2:54 PM 12/07/2022    2:20 PM 08/09/2022    2:11 PM  BP/Weight  Systolic BP [id number]  Diastolic BP 81 84 81  Wt. (Lbs)  189 188  BMI  25.63 kg/m2 25.5 kg/m2      Physical Exam Constitutional:      Appearance: He is well-developed.  Cardiovascular:     Rate and Rhythm: Normal rate.     Heart sounds: Normal heart sounds. No murmur heard. Pulmonary:     Effort: Pulmonary effort is normal.     Breath sounds: Normal breath sounds. No wheezing or rales.  Chest:     Chest wall: No tenderness.  Abdominal:     General: Bowel sounds are normal. There is no distension.     Palpations: Abdomen is soft. There is no mass.     Tenderness: There is no abdominal tenderness.  Musculoskeletal:        General: Normal range of motion.     Right lower leg: No edema.     Left lower leg: No edema.  Neurological:     Mental Status: He is alert and oriented to person, place, and time.  Psychiatric:        Mood and Affect: Mood normal.        Latest Ref Rng & Units 05/17/2022    3:01 AM 01/23/2013    1:30 PM  CMP  Glucose 70 - 99 mg/dL 111  100   BUN 6 - 20 mg/dL 25  28.7   Creatinine 0.61 - 1.24 mg/dL 1.13  1.1   Sodium 135 - 145 mmol/L 138  138   Potassium 3.5 - 5.1 mmol/L 4.1  4.0   Chloride 98 - 111 mmol/L 105  105   CO2 22 - 32 mmol/L 26  26   Calcium 8.9  - 10.3 mg/dL 9.5  9.6   Total Protein 6.4 - 8.3 g/dL  7.3   Total Bilirubin 0.20 - 1.20 mg/dL  0.49   Alkaline Phos 40 - 150 U/L  77   AST 5 - 34 U/L  25  ALT 0 - 55 U/L  29     Lipid Panel  No results found for: "CHOL", "TRIG", "HDL", "CHOLHDL", "VLDL", "LDLCALC", "LDLDIRECT"  CBC    Component Value Date/Time   WBC 5.8 05/18/2022 0456   RBC 5.04 05/18/2022 0456   HGB 13.0 05/18/2022 0456   HGB 14.5 01/23/2013 1330   HCT 41.0 05/18/2022 0456   HCT 43.7 01/23/2013 1330   PLT 249 05/18/2022 0456   PLT 211 01/23/2013 1330   MCV 81.3 05/18/2022 0456   MCV 83.4 01/23/2013 1330   MCH 25.8 (L) 05/18/2022 0456   MCHC 31.7 05/18/2022 0456   RDW 16.8 (H) 05/18/2022 0456   RDW 14.0 01/23/2013 1330   LYMPHSABS 1.7 05/17/2022 0301   LYMPHSABS 1.5 01/23/2013 1330   MONOABS 1.0 05/17/2022 0301   MONOABS 0.6 01/23/2013 1330   EOSABS 0.2 05/17/2022 0301   EOSABS 0.1 01/23/2013 1330   BASOSABS 0.0 05/17/2022 0301   BASOSABS 0.0 01/23/2013 1330    No results found for: "HGBA1C"  Assessment & Plan:  1. History of pulmonary embolism Completed 38-month course of anticoagulation with Eliquis Counseled that guidelines do not recommend repeat CT angiogram Given his insistence on repeating CT scan I have ordered a CT scan for reassurance. - CT Angio Chest W/Cm &/Or Wo Cm; Future  2. Screening for metabolic disorder - Basic Metabolic Panel  3. Need for hepatitis C screening test - HCV Ab w Reflex to Quant PCR   No orders of the defined types were placed in this encounter.   Follow-up: Return in about 1 year (around 12/07/2023) for [place], MD, FAAFP. [facility] and [place] Zeig[place], [place]   12/07/2022, 5:34 PM

## 2022-12-07 NOTE — Patient Instructions (Signed)
Exercising to Stay Healthy To become healthy and stay healthy, it is recommended that you do moderate-intensity and vigorous-intensity exercise. You can tell that you are exercising at a moderate intensity if your heart starts beating faster and you start breathing faster but can still hold a conversation. You can tell that you are exercising at a vigorous intensity if you are breathing much harder and faster and cannot hold a conversation while exercising. How can exercise benefit me? Exercising regularly is important. It has many health benefits, such as: Improving overall fitness, flexibility, and endurance. Increasing bone density. Helping with weight control. Decreasing body fat. Increasing muscle strength and endurance. Reducing stress and tension, anxiety, depression, or anger. Improving overall health. What guidelines should I follow while exercising? Before you start a new exercise program, talk with your health care provider. Do not exercise so much that you hurt yourself, feel dizzy, or get very short of breath. Wear comfortable clothes and wear shoes with good support. Drink plenty of water while you exercise to prevent dehydration or heat stroke. Work out until your breathing and your heartbeat get faster (moderate intensity). How often should I exercise? Choose an activity that you enjoy, and set realistic goals. Your health care provider can help you make an activity plan that is individually designed and works best for you. Exercise regularly as told by your health care provider. This may include: Doing strength training two times a week, such as: Lifting weights. Using resistance bands. Push-ups. Sit-ups. Yoga. Doing a certain intensity of exercise for a given amount of time. Choose from these options: A total of 150 minutes of moderate-intensity exercise every week. A total of 75 minutes of vigorous-intensity exercise every week. A mix of moderate-intensity and  vigorous-intensity exercise every week. Children, pregnant women, people who have not exercised regularly, people who are overweight, and older adults may need to talk with a health care provider about what activities are safe to perform. If you have a medical condition, be sure to talk with your health care provider before you start a new exercise program. What are some exercise ideas? Moderate-intensity exercise ideas include: Walking 1 mile (1.6 km) in about 15 minutes. Biking. Hiking. Golfing. Dancing. Water aerobics. Vigorous-intensity exercise ideas include: Walking 4.5 miles (7.2 km) or more in about 1 hour. Jogging or running 5 miles (8 km) in about 1 hour. Biking 10 miles (16.1 km) or more in about 1 hour. Lap swimming. Roller-skating or in-line skating. Cross-country skiing. Vigorous competitive sports, such as football, basketball, and soccer. Jumping rope. Aerobic dancing. What are some everyday activities that can help me get exercise? Yard work, such as: Pushing a lawn mower. Raking and bagging leaves. Washing your car. Pushing a stroller. Shoveling snow. Gardening. Washing windows or floors. How can I be more active in my day-to-day activities? Use stairs instead of an elevator. Take a walk during your lunch break. If you drive, park your car farther away from your work or school. If you take public transportation, get off one stop early and walk the rest of the way. Stand up or walk around during all of your indoor phone calls. Get up, stretch, and walk around every 30 minutes throughout the day. Enjoy exercise with a friend. Support to continue exercising will help you keep a regular routine of activity. Where to find more information You can find more information about exercising to stay healthy from: U.S. Department of Health and Human Services: www.hhs.gov Centers for Disease Control and Prevention (  CDC): www.cdc.gov Summary Exercising regularly is  important. It will improve your overall fitness, flexibility, and endurance. Regular exercise will also improve your overall health. It can help you control your weight, reduce stress, and improve your bone density. Do not exercise so much that you hurt yourself, feel dizzy, or get very short of breath. Before you start a new exercise program, talk with your health care provider. This information is not intended to replace advice given to you by your health care provider. Make sure you discuss any questions you have with your health care provider. Document Revised: 01/01/2021 Document Reviewed: 01/01/2021 Elsevier Patient Education  2023 [employer]  

## 2022-12-08 ENCOUNTER — Other Ambulatory Visit: Payer: Self-pay | Admitting: Family Medicine

## 2022-12-08 ENCOUNTER — Other Ambulatory Visit: Payer: Self-pay

## 2022-12-08 DIAGNOSIS — N179 Acute kidney failure, unspecified: Secondary | ICD-10-CM

## 2022-12-08 LAB — BASIC METABOLIC PANEL
BUN/Creatinine Ratio: 16 (ref 9–20)
BUN: 23 mg/dL (ref 6–24)
CO2: 23 mmol/L (ref 20–29)
Calcium: 9.9 mg/dL (ref 8.7–10.2)
Chloride: 102 mmol/L (ref 96–106)
Creatinine, Ser: 1.45 mg/dL — ABNORMAL HIGH (ref 0.76–1.27)
Glucose: 94 mg/dL (ref 70–99)
Potassium: 5.1 mmol/L (ref 3.5–5.2)
Sodium: 139 mmol/L (ref 134–144)
eGFR: 57 mL/min/{1.73_m2} — ABNORMAL LOW (ref >59)

## 2022-12-08 LAB — HCV AB W REFLEX TO QUANT PCR: HCV Ab: NONREACTIVE

## 2022-12-08 LAB — HCV INTERPRETATION

## 2022-12-13 ENCOUNTER — Ambulatory Visit (HOSPITAL_COMMUNITY): Payer: Self-pay

## 2022-12-26 ENCOUNTER — Other Ambulatory Visit (HOSPITAL_COMMUNITY): Payer: Self-pay

## 2022-12-27 ENCOUNTER — Ambulatory Visit (HOSPITAL_COMMUNITY)
Admission: RE | Admit: 2022-12-27 | Discharge: 2022-12-27 | Disposition: A | Discharge status: Home / Self Care | Payer: Self-pay | Source: Ambulatory Visit | Attending: Family Medicine | Admitting: Family Medicine

## 2022-12-27 ENCOUNTER — Other Ambulatory Visit: Payer: Self-pay | Admitting: Family Medicine

## 2022-12-27 DIAGNOSIS — I2510 Atherosclerotic heart disease of native coronary artery without angina pectoris: Secondary | ICD-10-CM

## 2022-12-27 DIAGNOSIS — Z86711 Personal history of pulmonary embolism: Secondary | ICD-10-CM | POA: Insufficient documentation

## 2022-12-27 MED ORDER — IOHEXOL 350 MG/ML SOLN
75.0000 mL | Freq: Once | INTRAVENOUS | Status: AC | PRN
  Administered 2022-12-27: 75 mL via INTRAVENOUS

## 2023-01-19 ENCOUNTER — Ambulatory Visit (INDEPENDENT_AMBULATORY_CARE_PROVIDER_SITE_OTHER): Payer: Self-pay | Admitting: Cardiology

## 2023-01-19 ENCOUNTER — Other Ambulatory Visit (HOSPITAL_BASED_OUTPATIENT_CLINIC_OR_DEPARTMENT_OTHER): Payer: Self-pay

## 2023-01-19 ENCOUNTER — Encounter (HOSPITAL_BASED_OUTPATIENT_CLINIC_OR_DEPARTMENT_OTHER): Payer: Self-pay | Admitting: Cardiology

## 2023-01-19 VITALS — BP 136/73 | HR 77 | Ht 72.0 in | Wt 183.2 lb

## 2023-01-19 DIAGNOSIS — Z712 Person consulting for explanation of examination or test findings: Secondary | ICD-10-CM

## 2023-01-19 DIAGNOSIS — E7800 Pure hypercholesterolemia, unspecified: Secondary | ICD-10-CM

## 2023-01-19 DIAGNOSIS — I2510 Atherosclerotic heart disease of native coronary artery without angina pectoris: Secondary | ICD-10-CM

## 2023-01-19 DIAGNOSIS — Z7189 Other specified counseling: Secondary | ICD-10-CM

## 2023-01-19 MED ORDER — ROSUVASTATIN CALCIUM 5 MG PO TABS
5.0000 mg | ORAL_TABLET | Freq: Every day | ORAL | 3 refills | Status: DC
Start: 2023-01-19 — End: 2024-01-17
  Filled 2023-01-19: qty 90, 90d supply, fill #0
  Filled 2023-04-19: qty 90, 90d supply, fill #1
  Filled 2023-07-20: qty 90, 90d supply, fill #2
  Filled 2023-10-25: qty 90, 90d supply, fill #3

## 2023-01-19 MED ORDER — ASPIRIN 81 MG PO TBEC: 81.0000 mg | DELAYED_RELEASE_TABLET | Freq: Every day | ORAL | 0 refills | Status: DC

## 2023-01-19 MED ORDER — ASPIRIN 81 MG PO TBEC
81.0000 mg | DELAYED_RELEASE_TABLET | Freq: Every day | ORAL | 3 refills | Status: DC
Start: 2023-01-19 — End: 2023-12-18

## 2023-01-19 NOTE — Progress Notes (Signed)
Cardiology Office Note:    Date:  01/19/2023   ID:  James Burns, DOB 10/16/1965, MRN [id number]  PCP:  Hoy Register, MD  Cardiologist:  Jodelle Red, MD  Referring MD: Hoy Register, MD   CC: New patient evaluation for coronary artery calcification  History of Present Illness:    James Burns is a 57 y.o. male with a hx of CAD, and melanoma (status post excision in 2014), acute pulmonary edema, acute DVT during hospitalization 04/2022, who is seen as a new consult at the request of Hoy Register, MD for the evaluation and management of coronary artery calcification.  On 05/17/2022 he was admitted to the hospital after presenting with right calf pain and swelling, DOE. A CT scan in the ED was positive for acute lobar and segmental PE with evidence of right heart strain consistent with at least submassive PE. Lower extremity ultrasound showed occlusive DVT in the right popliteal, right posterior tibial, and right peroneal veins. He was stable on heparin infusion and was transitioned to po Eliquis.  He was seen by Dr. Alvis Lemmings 12/07/2022 and was asymptomatic. He had been on anticoagulation since 05/2022 and completed 6 months of anticoagulation. Notes from hematology had recommended 6 months of anticoagulation. Hypercoagulable workup revealed heterozygous prothrombin gene mutation. At his visit with Dr. Alvis Lemmings he requested a repeat CT scan to ensure his PE completely resolved.   CT angiogram of the chest 12/27/2022 revealed no evidence for acute pulmonary embolus, mosaic attenuation pattern which may reflect small airways disease, and coronary artery calcifications of the LAD. Normal heart size and no pericardial effusion.   Cardiovascular risk factors: Prior clinical ASCVD: CAD per CT 12/2022. Prior PE/DVT 04/2022 as above. He follows with his hematologist Dr. Al Pimple. No significant bleeding issues on Eliquis. Comorbid conditions: Blood pressure has always been a little elevated. His  wife is a Engineer, civil (consulting), Currently he is not on antihypertensives or cholesterol medicine. Metabolic syndrome/Obesity:  Highest adult weight is 200 lbs, current weight in clinic 183 lbs. In 2005, he was heavier and more inactive. He had a stressful job, struggled with breathing issues, and had hemorrhoids/bleeding issues attributable to a fast-food lifestyle. At the time his heart was reportedly fine per his workup. After starting an exercise program and adjusting his diet, he lost weight and has been feeling well until he developed the blood clots.  Chronic inflammatory conditions: None. Family history: Hypertension is common in his maternal family. Both his mother and father have hypertension. No history of heart attack or stroke in his immediate family. He has 1 sister who has high blood pressure sometimes but not on antihypertensives. His paternal family has a significant history of prothrombin gene mutation.  Prior cardiac testing and/or incidental findings on other testing (ie coronary calcium): Calcifications of the LAD noted on CTA chest 12/2022. Exercise level: Currently he is active. Current diet: He endorses a healthy diet.  He denies any palpitations, chest pain, shortness of breath, or peripheral edema. No lightheadedness, headaches, syncope, orthopnea, or PND.  Past Medical History:  Diagnosis Date   Cancer (HCC)    Melanoma   Medical history non-contributory    Wears glasses     Past Surgical History:  Procedure Laterality Date   MELANOMA EXCISION WITH SENTINEL LYMPH NODE BIOPSY Right 11/30/2012   Procedure: MELANOMA wide EXCISION right back  WITH SENTINEL LYMPH NODE BIOPSY nuclear medicine injection 7:00;  Surgeon: Liz Malady, MD;  Location: [facility];  Service: General;  Laterality: Right;  melanoma nuclear medicine injection at 7:00 per [facility]    Rotator cuff surgery Bilateral    SIGMOIDOSCOPY     TONSILLECTOMY     WISDOM TOOTH EXTRACTION      Current  Medications: Current Outpatient Medications on File Prior to Visit  Medication Sig   sildenafil (VIAGRA) 50 MG tablet Take 1 tablet (50 mg total) by mouth daily as needed for erectile dysfunction. At least 24 hours between doses.   No current facility-administered medications on file prior to visit.     Allergies:   Patient has no known allergies.   Social History   Tobacco Use   Smoking status: Never  Substance Use Topics   Alcohol use: No   Drug use: No    Family History: Hypertension is common in his maternal family. Both his mother and father have hypertension. No history of heart attack or stroke in his immediate family. He has 1 sister who has high blood pressure sometimes but not on antihypertensives. His paternal family has a significant history of prothrombin gene mutation.   ROS:   Please see the history of present illness.  Additional pertinent ROS: Constitutional: Negative for chills, fever, night sweats, unintentional weight loss  HENT: Negative for ear pain and hearing loss.   Eyes: Negative for loss of vision and eye pain.  Respiratory: Negative for cough, sputum, wheezing.   Cardiovascular: See HPI. Gastrointestinal: Negative for abdominal pain, melena, and hematochezia.  Genitourinary: Negative for dysuria and hematuria.  Musculoskeletal: Negative for falls and myalgias.  Skin: Negative for itching and rash.  Neurological: Negative for focal weakness, focal sensory changes and loss of consciousness.  Endo/Heme/Allergies: Does not bruise/bleed easily.     EKGs/Labs/Other Studies Reviewed:    The following studies were reviewed today:  CTA Chest  12/27/2022: IMPRESSION: 1. No evidence for acute pulmonary embolus. 2. Mosaic attenuation pattern identified within both lungs which may reflect small airways disease. 3. LAD coronary artery calcifications.  Echo  05/18/2022: 1. Left ventricular ejection fraction, by estimation, is 60 to 65%. The  left ventricle  has normal function. The left ventricle has no regional  wall motion abnormalities. Left ventricular diastolic parameters are  consistent with Grade I diastolic  dysfunction (impaired relaxation). The average left ventricular global  longitudinal strain is -19.2 %. The global longitudinal strain is normal.   2. Right ventricular systolic function is normal. The right ventricular  size is mildly enlarged. Tricuspid regurgitation signal is inadequate for  assessing PA pressure.   3. The mitral valve is normal in structure. No evidence of mitral valve  regurgitation. No evidence of mitral stenosis.   4. The aortic valve is tricuspid. Aortic valve regurgitation is not  visualized. No aortic stenosis is present.   5. The inferior vena cava is normal in size with greater than 50%  respiratory variability, suggesting right atrial pressure of 3 mmHg.    EKG:  EKG is personally reviewed.   01/19/2023:  EKG was not ordered.  Recent Labs: 05/17/2022: B Natriuretic Peptide 9.9 05/18/2022: Hemoglobin 13.0; Platelets 249 12/07/2022: BUN 23; Creatinine, Ser 1.45; Potassium 5.1; Sodium 139   Recent Lipid Panel No results found for: "CHOL", "TRIG", "HDL", "CHOLHDL", "VLDL", "LDLCALC", "LDLDIRECT"  Physical Exam:    VS:  BP 136/73   Pulse 77   Ht 6' (1.829 m)   Wt 183 lb 3.2 oz (83.1 kg)   SpO2 97%   BMI 24.85 kg/m     Wt Readings from Last 3 Encounters:  01/19/23 183 lb 3.2 oz (83.1 kg)  12/07/22 189 lb (85.7 kg)  08/09/22 188 lb (85.3 kg)    GEN: Well nourished, well developed in no acute distress HEENT: Normal, moist mucous membranes NECK: No JVD CARDIAC: regular rhythm, normal S1 and S2, no rubs or gallops. No murmur. VASCULAR: Radial and DP pulses 2+ bilaterally. No carotid bruits RESPIRATORY:  Clear to auscultation without rales, wheezing or rhonchi  ABDOMEN: Soft, non-tender, non-distended MUSCULOSKELETAL:  Ambulates independently SKIN: Warm and dry, no edema NEUROLOGIC:  Alert and  oriented x 3. No focal neuro deficits noted. PSYCHIATRIC:  Normal affect    ASSESSMENT:    1. Coronary artery calcification seen on CT scan   2. Nonocclusive coronary atherosclerosis of native coronary artery   3. Encounter to discuss test results   4. Cardiac risk counseling   5. Counseling on health promotion and disease prevention   6. Pure hypercholesterolemia    PLAN:    Coronary artery calcification on CT, consistent with nonobstructive CAD Hyeprcholesterolemia -reviewed test results today -discussed recommendations for aspirin and statin, he is amenable. Repeat labs 3 mos -reviewed red flag warning signs that need immediate medical attention  Cardiac risk counseling and prevention recommendations: -recommend heart healthy/Mediterranean diet, with whole grains, fruits, vegetable, fish, lean meats, nuts, and olive oil. Limit salt. -recommend moderate walking, 3-5 times/week for 30-50 minutes each session. Aim for at least 150 minutes.week. Goal should be pace of 3 miles/hours, or walking 1.5 miles in 30 minutes -recommend avoidance of tobacco products. Avoid excess alcohol.  Plan for follow up: 1 year, or sooner as needed.  Jodelle Red, MD, PhD, [facility]  [facility]    Medication Adjustments/Labs and Tests Ordered: Current medicines are reviewed at length with the patient today.  Concerns regarding medicines are outlined above.   No orders of the defined types were placed in this encounter.  Meds ordered this encounter  Medications   rosuvastatin (CRESTOR) 5 MG tablet    Sig: Take 1 tablet (5 mg total) by mouth daily.    Dispense:  90 tablet    Refill:  3   aspirin EC 81 MG tablet    Sig: Take 1 tablet (81 mg total) by mouth daily. Swallow whole.    Dispense:  90 tablet    Refill:  3   Patient Instructions  Medication Instructions:  Your physician has recommended you make the following change in your medication:   Start: Aspirin 81mg   daily- Start this on Monday after your labs   Start: Rosuvastatin 5mg  daily- start this on Monday after your labs  *If you need a refill on your cardiac medications before your next appointment, please call your pharmacy*   Lab Work: Your physician recommends that you return for lab work on Monday for Fasting Lipid Panel   Please return for Lab work in 3 months for Fasting Lipid Panel and Liver Function Tests. You may come to the...   [facility] (3rd floor) [address] [place], [place] [address]  Open: 8am-Noon and 1pm-4:30pm  Please ring the doorbell on the small table when you exit the elevator and the Lab Tech will come get you  [facility] at [facility] [address], [place], [place] [address] Open: 8am-1pm, then 2pm-4:30pm   [facility]- Please see attached locations sheet stapled to your lab work with address and hours.    If you have labs (blood work) drawn today and your tests are  completely normal, you will receive your results only by: MyChart Message (if you have MyChart) OR A paper copy in the mail If you have any lab test that is abnormal or we need to change your treatment, we will call you to review the results.  Follow-Up: At [facility], you and your health needs are our priority.  As part of our continuing mission to provide you with exceptional heart care, we have created designated Provider Care Teams.  These Care Teams include your primary Cardiologist (physician) and Advanced Practice Providers (APPs -  Physician Assistants and Nurse Practitioners) who all work together to provide you with the care you need, when you need it.  We recommend signing up for the patient portal called "MyChart".  Sign up information is provided on this After Visit Summary.  MyChart is used to connect with patients for Virtual Visits (Telemedicine).  Patients are able to view lab/test results, encounter notes, upcoming  appointments, etc.  Non-urgent messages can be sent to your provider as well.   To learn more about what you can do with MyChart, go to [email or web address].    Your next appointment:   1 year(s)  Provider:   Jodelle Red, MD       [facility],acting as a scribe for Jodelle Red, MD.,have documented all relevant documentation on the behalf of Jodelle Red, MD,as directed by  Jodelle Red, MD while in the presence of Jodelle Red, MD.  I, Jodelle Red, MD, have reviewed all documentation for this visit. The documentation on 03/09/23 for the exam, diagnosis, procedures, and orders are all accurate and complete.   Signed, Jodelle Red, MD PhD 01/19/2023 4:12 PM    [facility]

## 2023-01-19 NOTE — Patient Instructions (Addendum)
Medication Instructions:  Your physician has recommended you make the following change in your medication:   Start: Aspirin 81mg  daily- Start this on Monday after your labs   Start: Rosuvastatin 5mg  daily- start this on Monday after your labs  *If you need a refill on your cardiac medications before your next appointment, please call your pharmacy*   Lab Work: Your physician recommends that you return for lab work on Monday for Fasting Lipid Panel   Please return for Lab work in 3 months for Fasting Lipid Panel and Liver Function Tests. You may come to the...   [facility] (3rd floor) [address], [place], [place] [address]  Open: 8am-Noon and 1pm-4:30pm  Please ring the doorbell on the small table when you exit the elevator and the Lab Tech will come get you  [facility] at [facility] [address], [place], [place] [address] Open: 8am-1pm, then 2pm-4:30pm   [facility]- Please see attached locations sheet stapled to your lab work with address and hours.    If you have labs (blood work) drawn today and your tests are completely normal, you will receive your results only by: MyChart Message (if you have MyChart) OR A paper copy in the mail If you have any lab test that is abnormal or we need to change your treatment, we will call you to review the results.  Follow-Up: At [facility], you and your health needs are our priority.  As part of our continuing mission to provide you with exceptional heart care, we have created designated Provider Care Teams.  These Care Teams include your primary Cardiologist (physician) and Advanced Practice Providers (APPs -  Physician Assistants and Nurse Practitioners) who all work together to provide you with the care you need, when you need it.  We recommend signing up for the patient portal called "MyChart".  Sign up information is provided on this After Visit Summary.  MyChart is used to  connect with patients for Virtual Visits (Telemedicine).  Patients are able to view lab/test results, encounter notes, upcoming appointments, etc.  Non-urgent messages can be sent to your provider as well.   To learn more about what you can do with MyChart, go to [email or web address].    Your next appointment:   1 year(s)  Provider:   Jodelle Red, MD

## 2023-01-20 ENCOUNTER — Telehealth: Payer: Self-pay | Admitting: Licensed Clinical Social Worker

## 2023-01-20 NOTE — Telephone Encounter (Signed)
H&V Care Navigation CSW Progress Note  Clinical Social Worker contacted patient by phone to f/u after appt with Dr. Cristal Deer as pt currently uninsured. No answer at [phone], left voicemail requesting return call. Will re-attempt as able.  Patient is participating in a Managed Medicaid Plan:  No, self pay only  SDOH Screenings   Food Insecurity: No Food Insecurity (12/07/2022)  Housing: Low Risk  (12/07/2022)  Transportation Needs: No Transportation Needs (12/07/2022)  Alcohol Screen: Low Risk  (12/07/2022)  Depression (PHQ2-9): Low Risk  (12/07/2022)  Financial Resource Strain: Low Risk  (12/07/2022)  Physical Activity: Sufficiently Active (12/07/2022)  Social Connections: Moderately Integrated (12/07/2022)  Stress: No Stress Concern Present (12/07/2022)  Tobacco Use: Unknown (01/19/2023)   Octavio Graves, MSW, LCSW Clinical Social Worker II [facility] Heart/Vascular Care Navigation  [phone]- work cell phone (preferred) [phone]- desk phone

## 2023-01-24 LAB — LIPID PANEL
Chol/HDL Ratio: 3.7 ratio (ref 0.0–5.0)
Cholesterol, Total: 202 mg/dL — ABNORMAL HIGH (ref 100–199)
HDL: 54 mg/dL (ref >39)
LDL Chol Calc (NIH): 129 mg/dL — ABNORMAL HIGH (ref 0–99)
Triglycerides: 108 mg/dL (ref 0–149)
VLDL Cholesterol Cal: 19 mg/dL (ref 5–40)

## 2023-01-25 ENCOUNTER — Telehealth: Payer: Self-pay | Admitting: Licensed Clinical Social Worker

## 2023-01-25 NOTE — Telephone Encounter (Signed)
H&V Care Navigation CSW Progress Note  Clinical Social Worker contacted patient by phone to f/u after appt with Dr. Cristal Deer as pt currently uninsured. No answer at [phone], left voicemail requesting return call. I will mail pt [facility] Assistance and my card if interested in assistance. Remain available as needed.   Patient is participating in a Managed Medicaid Plan:  No, self pay only   SDOH Screenings   Food Insecurity: No Food Insecurity (12/07/2022)  Housing: Low Risk  (12/07/2022)  Transportation Needs: No Transportation Needs (12/07/2022)  Alcohol Screen: Low Risk  (12/07/2022)  Depression (PHQ2-9): Low Risk  (12/07/2022)  Financial Resource Strain: Low Risk  (12/07/2022)  Physical Activity: Sufficiently Active (12/07/2022)  Social Connections: Moderately Integrated (12/07/2022)  Stress: No Stress Concern Present (12/07/2022)  Tobacco Use: Unknown (01/19/2023)   Octavio Graves, MSW, LCSW Clinical Social Worker II [facility] Heart/Vascular Care Navigation  [phone]- work cell phone (preferred) [phone]- desk phone

## 2023-01-25 NOTE — Telephone Encounter (Signed)
H&V Care Navigation CSW Progress Note  Clinical Social Worker  received a return call from pt  to f/u on my calls. Introduced self, role, reason for call. Pt shares he applied for CAFA, but unfortunately he and his wife make too much to qualify for discount at this time. 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 He currently utilizes a [employer] plan and we discussed that means to the system he is still eligible to apply for CAFA. Guidelines for income shift slightly each year so if he does find himself in a bind he is able to apply again. Let him know I have mailed him a copy of the [facility] financial assistance and he can dispose of it or keep it on hand. I also shared that my card is in the packet as well and he is welcome to give me a call if any additional questions/concerns arise. No additional concerns shared at this time.  Patient is participating in a Managed Medicaid Plan:  No, self pay only.   SDOH Screenings   Food Insecurity: No Food Insecurity (12/07/2022)  Housing: Low Risk  (12/07/2022)  Transportation Needs: No Transportation Needs (12/07/2022)  Alcohol Screen: Low Risk  (12/07/2022)  Depression (PHQ2-9): Low Risk  (12/07/2022)  Financial Resource Strain: Low Risk  (12/07/2022)  Physical Activity: Sufficiently Active (12/07/2022)  Social Connections: Moderately Integrated (12/07/2022)  Stress: No Stress Concern Present (12/07/2022)  Tobacco Use: Unknown (01/19/2023)   Octavio Graves, MSW, LCSW Clinical Social Worker II [facility] Heart/Vascular Care Navigation  [phone]- work cell phone (preferred) [phone]- desk phone

## 2023-03-09 ENCOUNTER — Encounter (HOSPITAL_BASED_OUTPATIENT_CLINIC_OR_DEPARTMENT_OTHER): Payer: Self-pay | Admitting: Cardiology

## 2023-04-20 ENCOUNTER — Other Ambulatory Visit (HOSPITAL_BASED_OUTPATIENT_CLINIC_OR_DEPARTMENT_OTHER): Payer: Self-pay

## 2023-07-20 ENCOUNTER — Encounter (HOSPITAL_BASED_OUTPATIENT_CLINIC_OR_DEPARTMENT_OTHER): Payer: Self-pay

## 2023-07-20 ENCOUNTER — Other Ambulatory Visit: Payer: Self-pay | Admitting: Family Medicine

## 2023-07-20 NOTE — Telephone Encounter (Signed)
Requested medications are due for refill today.  yes  Requested medications are on the active medications list.  yes  Last refill. 06/16/2022 #10 1 rf  Future visit scheduled.   yes  Notes to clinic.  Labs are expired.    Requested Prescriptions  Pending Prescriptions Disp Refills   sildenafil (VIAGRA) 50 MG tablet 10 tablet 1    Sig: Take 1 tablet (50 mg total) by mouth daily as needed for erectile dysfunction. At least 24 hours between doses.     Urology: Erectile Dysfunction Agents Failed - 07/20/2023  5:55 PM      Failed - AST in normal range and within 360 days    AST  Date Value Ref Range Status  01/23/2013 25 5 - 34 U/L Final         Failed - ALT in normal range and within 360 days    ALT  Date Value Ref Range Status  01/23/2013 29 0 - 55 U/L Final         Passed - Last BP in normal range    BP Readings from Last 1 Encounters:  01/19/23 136/73         Passed - Valid encounter within last 12 months    Recent Outpatient Visits           7 months ago History of pulmonary embolism   [facility] Bearden, [place], MD   11 months ago Acute pulmonary embolism without acute cor pulmonale, unspecified pulmonary embolism type [facility])   [facility] [place], Odette Horns, MD   1 year ago Acute pulmonary embolism without acute cor pulmonale, unspecified pulmonary embolism type [facility])   [facility] Hoy Register, MD       Future Appointments             In 5 months Hoy Register, MD [facility]

## 2023-07-21 ENCOUNTER — Other Ambulatory Visit: Payer: Self-pay

## 2023-07-21 MED ORDER — SILDENAFIL CITRATE 50 MG PO TABS
50.0000 mg | ORAL_TABLET | Freq: Every day | ORAL | 1 refills | Status: DC | PRN
  Filled 2023-07-21: qty 10, 10d supply, fill #0

## 2023-07-22 ENCOUNTER — Other Ambulatory Visit (HOSPITAL_BASED_OUTPATIENT_CLINIC_OR_DEPARTMENT_OTHER): Payer: Self-pay

## 2023-12-12 ENCOUNTER — Encounter: Payer: Self-pay | Admitting: Family Medicine

## 2023-12-18 ENCOUNTER — Encounter: Payer: Self-pay | Admitting: Family Medicine

## 2023-12-18 ENCOUNTER — Other Ambulatory Visit: Payer: Self-pay

## 2023-12-18 ENCOUNTER — Ambulatory Visit: Payer: Self-pay | Attending: Family Medicine | Admitting: Family Medicine

## 2023-12-18 VITALS — BP 136/86 | HR 71 | Temp 99.1°F | Ht 73.0 in | Wt 184.0 lb

## 2023-12-18 DIAGNOSIS — Z13228 Encounter for screening for other metabolic disorders: Secondary | ICD-10-CM

## 2023-12-18 DIAGNOSIS — I2510 Atherosclerotic heart disease of native coronary artery without angina pectoris: Secondary | ICD-10-CM

## 2023-12-18 DIAGNOSIS — N529 Male erectile dysfunction, unspecified: Secondary | ICD-10-CM

## 2023-12-18 DIAGNOSIS — Z0001 Encounter for general adult medical examination with abnormal findings: Secondary | ICD-10-CM

## 2023-12-18 DIAGNOSIS — I749 Embolism and thrombosis of unspecified artery: Secondary | ICD-10-CM

## 2023-12-18 DIAGNOSIS — Z125 Encounter for screening for malignant neoplasm of prostate: Secondary | ICD-10-CM

## 2023-12-18 DIAGNOSIS — Z131 Encounter for screening for diabetes mellitus: Secondary | ICD-10-CM

## 2023-12-18 MED ORDER — ZOSTER VAC RECOMB ADJUVANTED 50 MCG/0.5ML IM SUSR
0.5000 mL | Freq: Once | INTRAMUSCULAR | 1 refills | Status: AC
  Filled 2023-12-18: qty 0.5, 1d supply, fill #0
  Filled 2023-12-22: qty 1, 1d supply, fill #0
  Filled 2024-06-14: qty 1, 1d supply, fill #1

## 2023-12-18 MED ORDER — SILDENAFIL CITRATE 50 MG PO TABS
50.0000 mg | ORAL_TABLET | Freq: Every day | ORAL | 1 refills | Status: AC | PRN
  Filled 2023-12-18: qty 10, 30d supply, fill #0
  Filled 2024-07-23: qty 10, 30d supply, fill #1
  Filled 2024-07-23: qty 10, 30d supply, fill #0

## 2023-12-18 NOTE — Progress Notes (Signed)
 Subjective:  Patient ID: James Burns, male    DOB: [birth year]  Age: 58 y.o. MRN: [id number]  CC: Annual Exam     Discussed the use of AI scribe software for clinical note transcription with the patient, who gave verbal consent to proceed.  History of Present Illness The patient with a history of malignant melanoma (status post excision in 2014), acute pulmonary edema, acute DVT during hospitalization from 05/17/2022 through 05/18/2022) completed anticoagulation with Eliquis) presents for an annual physical. He reports a regular exercise pattern of four to six hours per week, primarily biking. He has a good intake of fruits, but is working on increasing vegetable consumption. He sees a Education officer, community regularly and last saw an optometrist or ophthalmologist last summer.  The patient has a history of blood clots and was on Eliquis for six to eight months. He reports that this period was rough, but he overcame it.  He had incidental findings of coronary artery calcifications on his CT.  He is now on a statin (rosuvastatin) and reports feeling much better with no symptoms of blood clots or pain. He also reports that his shortness of breath, which was due to a pulmonary embolism, has resolved. He is aware of the need for precautions when traveling, such as wearing compression socks during long drives or flights.  The patient's wife has been encouraging him to get the shingles vaccine, which he is considering. He is also taking aspirin daily, as advised by his hematologist.     Past Medical History:  Diagnosis Date   Cancer (HCC)    Melanoma   Medical history non-contributory    Wears glasses     Past Surgical History:  Procedure Laterality Date   MELANOMA EXCISION WITH SENTINEL LYMPH NODE BIOPSY Right 11/30/2012   Procedure: MELANOMA wide EXCISION right back  WITH SENTINEL LYMPH NODE BIOPSY nuclear medicine injection 7:00;  Surgeon: Liz Malady, MD;  Location: [facility];   Service: General;  Laterality: Right;  melanoma nuclear medicine injection at 7:00 per Toni    Rotator cuff surgery Bilateral    SIGMOIDOSCOPY     TONSILLECTOMY     WISDOM TOOTH EXTRACTION      History reviewed. No pertinent family history.  Social History   Socioeconomic History   Marital status: Married    Spouse name: Not on file   Number of children: Not on file   Years of education: Not on file   Highest education level: Associate degree: occupational, Scientist, product/process development, or vocational program  Occupational History   Not on file  Tobacco Use   Smoking status: Never   Smokeless tobacco: Not on file  Substance and Sexual Activity   Alcohol use: No   Drug use: No   Sexual activity: Not on file  Other Topics Concern   Not on file  Social History Narrative   Not on file   Social Drivers of Health   Financial Resource Strain: Low Risk  (12/07/2022)   Overall Financial Resource Strain (CARDIA)    Difficulty of Paying Living Expenses: Not hard at all  Food Insecurity: No Food Insecurity (12/07/2022)   Hunger Vital Sign    Worried About Running Out of Food in the Last Year: Never true    Ran Out of Food in the Last Year: Never true  Transportation Needs: No Transportation Needs (12/07/2022)   PRAPARE - Administrator, Civil Service (Medical): No    Lack of Transportation (Non-Medical): No  Physical  Activity: Sufficiently Active (12/07/2022)   Exercise Vital Sign    Days of Exercise per Week: 7 days    Minutes of Exercise per Session: 30 min  Stress: No Stress Concern Present (12/07/2022)   [employer] of Occupational Health - Occupational Stress Questionnaire    Feeling of Stress : Not at all  Social Connections: Moderately Integrated (12/07/2022)   Social Connection and Isolation Panel [NHANES]    Frequency of Communication with Friends and Family: More than three times a week    Frequency of Social Gatherings with Friends and Family: More than three times a week     Attends Religious Services: More than 4 times per year    Active Member of [employer] or Organizations: No    Attends Engineer, structural: Not on file    Marital Status: Married    No Known Allergies  Outpatient Medications Prior to Visit  Medication Sig Dispense Refill   rosuvastatin (CRESTOR) 5 MG tablet Take 1 tablet (5 mg total) by mouth daily. 90 tablet 3   sildenafil (VIAGRA) 50 MG tablet Take 1 tablet (50 mg total) by mouth daily as needed for erectile dysfunction. At least 24 hours between doses. 10 tablet 1   aspirin EC 81 MG tablet Take 1 tablet (81 mg total) by mouth daily. Swallow whole. 160 tablet 0   aspirin EC 81 MG tablet Take 1 tablet (81 mg total) by mouth daily. Swallow whole. 90 tablet 3   No facility-administered medications prior to visit.     ROS Review of Systems  Constitutional:  Negative for activity change and appetite change.  HENT:  Negative for sinus pressure and sore throat.   Eyes:  Negative for visual disturbance.  Respiratory:  Negative for cough, chest tightness and shortness of breath.   Cardiovascular:  Negative for chest pain and leg swelling.  Gastrointestinal:  Negative for abdominal distention, abdominal pain, constipation and diarrhea.  Endocrine: Negative.   Genitourinary:  Negative for dysuria.  Musculoskeletal:  Negative for joint swelling and myalgias.  Skin:  Negative for rash.  Allergic/Immunologic: Negative.   Neurological:  Negative for weakness, light-headedness and numbness.  Psychiatric/Behavioral:  Negative for dysphoric mood and suicidal ideas.     Objective:  BP 136/86 (BP Location: Right Arm, Patient Position: Sitting, Cuff Size: Normal)   Pulse 71   Temp 99.1 F (37.3 C) (Oral)   Ht 6\' 1"  (1.854 m)   Wt 184 lb (83.5 kg)   BMI 24.28 kg/m      12/18/2023    3:22 PM 01/19/2023    3:17 PM 12/07/2022    2:54 PM  BP/Weight  Systolic BP 136 136 156  Diastolic BP 86 73 81  Wt. (Lbs) 184 183.2   BMI 24.28  kg/m2 24.85 kg/m2       Physical Exam Constitutional:      Appearance: He is well-developed.  HENT:     Head: Normocephalic and atraumatic.     Right Ear: External ear normal.     Left Ear: External ear normal.  Eyes:     Conjunctiva/sclera: Conjunctivae normal.     Pupils: Pupils are equal, round, and reactive to light.  Neck:     Trachea: No tracheal deviation.  Cardiovascular:     Rate and Rhythm: Normal rate and regular rhythm.     Heart sounds: Normal heart sounds. No murmur heard. Pulmonary:     Effort: Pulmonary effort is normal. No respiratory distress.     Breath  sounds: Normal breath sounds. No wheezing.  Chest:     Chest wall: No tenderness.  Abdominal:     General: Bowel sounds are normal.     Palpations: Abdomen is soft. There is no mass.     Tenderness: There is no abdominal tenderness.  Musculoskeletal:        General: No tenderness. Normal range of motion.     Cervical back: Normal range of motion and neck supple.  Skin:    General: Skin is warm and dry.  Neurological:     Mental Status: He is alert and oriented to person, place, and time.        Latest Ref Rng & Units 12/07/2022    2:47 PM 05/17/2022    3:01 AM 01/23/2013    1:30 PM  CMP  Glucose 70 - 99 mg/dL 94  161  096   BUN 6 - 24 mg/dL 23  25  04.5   Creatinine 0.76 - 1.27 mg/dL 4.09  8.11  1.1   Sodium 134 - 144 mmol/L 139  138  138   Potassium 3.5 - 5.2 mmol/L 5.1  4.1  4.0   Chloride 96 - 106 mmol/L 102  105  105   CO2 20 - 29 mmol/L 23  26  26    Calcium 8.7 - 10.2 mg/dL 9.9  9.5  9.6   Total Protein 6.4 - 8.3 g/dL   7.3   Total Bilirubin 0.20 - 1.20 mg/dL   9.14   Alkaline Phos 40 - 150 U/L   77   AST 5 - 34 U/L   25   ALT 0 - 55 U/L   29     Lipid Panel     Component Value Date/Time   CHOL 202 (H) 01/23/2023 0805   TRIG 108 01/23/2023 0805   HDL 54 01/23/2023 0805   CHOLHDL 3.7 01/23/2023 0805   LDLCALC 129 (H) 01/23/2023 0805    CBC    Component Value Date/Time   WBC  5.8 05/18/2022 0456   RBC 5.04 05/18/2022 0456   HGB 13.0 05/18/2022 0456   HGB 14.5 01/23/2013 1330   HCT 41.0 05/18/2022 0456   HCT 43.7 01/23/2013 1330   PLT 249 05/18/2022 0456   PLT 211 01/23/2013 1330   MCV 81.3 05/18/2022 0456   MCV 83.4 01/23/2013 1330   MCH 25.8 (L) 05/18/2022 0456   MCHC 31.7 05/18/2022 0456   RDW 16.8 (H) 05/18/2022 0456   RDW 14.0 01/23/2013 1330   LYMPHSABS 1.7 05/17/2022 0301   LYMPHSABS 1.5 01/23/2013 1330   MONOABS 1.0 05/17/2022 0301   MONOABS 0.6 01/23/2013 1330   EOSABS 0.2 05/17/2022 0301   EOSABS 0.1 01/23/2013 1330   BASOSABS 0.0 05/17/2022 0301   BASOSABS 0.0 01/23/2013 1330    No results found for: "HGBA1C"     Assessment & Plan Annual physical exam with abnormal findings Routine physical examination. Engages in regular exercise (4-6 hours/week), maintains a diet rich in fruits, and attends regular dental visits. Vision prescription updated last summer. Labs to include prostate cancer screening, diabetes screening, lipid profile, renal and hepatic function, and CBC. Shingles vaccination discussed; administration to be coordinated with pharmacy due to lack of insurance. - Order labs for prostate cancer screening, diabetes screening, lipid profile, renal and hepatic function, and CBC. Complete labs on Thursday morning while fasting. - Send prescription for Shingrix to pharmacy. Discuss cost and administration with pharmacy due to lack of insurance.   History of DVT  and pulmonary Embolism Treated with Eliquis.  Symptoms, including dyspnea, resolved. Currently on rosuvastatin and aspirin per hematologist's recommendation. No personal history of thrombosis, but paternal relatives have prothrombin gene mutation. Uses compression stockings during travel to prevent recurrence. - Use compression stockings during long travel to prevent recurrence of thrombosis.   Coronary artery calcification -- Continue rosuvastatin and aspirin as prescribed  by hematologist.   Erectile dysfunction -Viagra refilled  Follow-up To follow up with hematologist regarding the duration of rosuvastatin therapy.  Prefers not to continue medication indefinitely without further consultation. - Review lab results via MyChart and communicate findings.      Meds ordered this encounter  Medications   sildenafil (VIAGRA) 50 MG tablet    Sig: Take 1 tablet (50 mg total) by mouth daily as needed for erectile dysfunction. At least 24 hours between doses.    Dispense:  10 tablet    Refill:  1   Zoster Vaccine Adjuvanted [facility]) injection    Sig: Inject 0.5 mLs into the muscle once for 1 dose.    Dispense:  1 each    Refill:  1    Second dose in 2 months.    Follow-up: Return in about 1 year (around 12/17/2024) for CPE/ Preventive Health Exam.       Hoy Register, MD, FAAFP. [facility] [place], [place] [phone]   12/18/2023, 3:53 PM

## 2023-12-18 NOTE — Patient Instructions (Signed)
 VISIT SUMMARY:  You came in today for your annual physical. You are maintaining a regular exercise routine and a healthy diet, and you are keeping up with your dental and vision check-ups. We discussed your history of blood clots and your current medications, as well as the possibility of getting the shingles vaccine.  YOUR PLAN:  -HISTORY OF PULMONARY EMBOLISM: A pulmonary embolism is a blockage in one of the pulmonary arteries in your lungs, usually caused by blood clots. You had an acute pulmonary embolism which was treated with Eliquis. Your symptoms have resolved.  Use compression stockings during long travel to prevent recurrence.    -CORONARY ARTERY CALCIFICATIONS :you are currently taking rosuvastatin and aspirin as recommended by your Cardiologist. Continue taking these medications   -GENERAL HEALTH MAINTENANCE: You are doing well with regular exercise and a healthy diet. We will conduct routine lab tests, including screenings for prostate cancer, diabetes, lipid profile, renal and hepatic function, and a complete blood count. We also discussed the shingles vaccine, and I will send a prescription to the pharmacy. Please discuss the cost and administration with them due to your lack of insurance.  INSTRUCTIONS:  Please follow up with your Cardiologist regarding the duration of your rosuvastatin therapy. Also, complete your lab tests on Thursday morning while fasting, and review the results via MyChart. We will communicate the findings to you.

## 2023-12-20 ENCOUNTER — Other Ambulatory Visit: Payer: Self-pay

## 2023-12-21 ENCOUNTER — Other Ambulatory Visit: Payer: Self-pay

## 2023-12-22 ENCOUNTER — Other Ambulatory Visit: Payer: Self-pay

## 2024-01-17 ENCOUNTER — Other Ambulatory Visit (HOSPITAL_BASED_OUTPATIENT_CLINIC_OR_DEPARTMENT_OTHER): Payer: Self-pay | Admitting: Cardiology

## 2024-01-17 ENCOUNTER — Other Ambulatory Visit (HOSPITAL_BASED_OUTPATIENT_CLINIC_OR_DEPARTMENT_OTHER): Payer: Self-pay

## 2024-01-17 DIAGNOSIS — I2510 Atherosclerotic heart disease of native coronary artery without angina pectoris: Secondary | ICD-10-CM

## 2024-01-17 MED ORDER — ROSUVASTATIN CALCIUM 5 MG PO TABS
5.0000 mg | ORAL_TABLET | Freq: Every day | ORAL | 0 refills | Status: DC
  Filled 2024-01-17: qty 90, 90d supply, fill #0

## 2024-02-16 ENCOUNTER — Other Ambulatory Visit: Payer: Self-pay

## 2024-04-26 ENCOUNTER — Ambulatory Visit (HOSPITAL_BASED_OUTPATIENT_CLINIC_OR_DEPARTMENT_OTHER): Payer: Self-pay | Admitting: Cardiology

## 2024-04-29 ENCOUNTER — Other Ambulatory Visit (HOSPITAL_BASED_OUTPATIENT_CLINIC_OR_DEPARTMENT_OTHER): Payer: Self-pay

## 2024-04-29 ENCOUNTER — Other Ambulatory Visit (HOSPITAL_BASED_OUTPATIENT_CLINIC_OR_DEPARTMENT_OTHER): Payer: Self-pay | Admitting: Cardiology

## 2024-04-29 DIAGNOSIS — I2510 Atherosclerotic heart disease of native coronary artery without angina pectoris: Secondary | ICD-10-CM

## 2024-04-29 MED ORDER — ROSUVASTATIN CALCIUM 5 MG PO TABS
5.0000 mg | ORAL_TABLET | Freq: Every day | ORAL | 0 refills | Status: DC
  Filled 2024-04-29: qty 90, 90d supply, fill #0

## 2024-05-02 ENCOUNTER — Other Ambulatory Visit: Payer: Self-pay

## 2024-06-14 ENCOUNTER — Other Ambulatory Visit: Payer: Self-pay

## 2024-07-08 ENCOUNTER — Ambulatory Visit (HOSPITAL_BASED_OUTPATIENT_CLINIC_OR_DEPARTMENT_OTHER): Payer: Self-pay | Admitting: Cardiology

## 2024-07-08 ENCOUNTER — Encounter (HOSPITAL_BASED_OUTPATIENT_CLINIC_OR_DEPARTMENT_OTHER): Payer: Self-pay | Admitting: Cardiology

## 2024-07-08 VITALS — BP 128/78 | HR 79 | Ht 73.0 in | Wt 180.0 lb

## 2024-07-08 DIAGNOSIS — Z7189 Other specified counseling: Secondary | ICD-10-CM | POA: Diagnosis not present

## 2024-07-08 DIAGNOSIS — Z86711 Personal history of pulmonary embolism: Secondary | ICD-10-CM | POA: Diagnosis not present

## 2024-07-08 DIAGNOSIS — I2510 Atherosclerotic heart disease of native coronary artery without angina pectoris: Secondary | ICD-10-CM | POA: Diagnosis not present

## 2024-07-08 DIAGNOSIS — E7800 Pure hypercholesterolemia, unspecified: Secondary | ICD-10-CM

## 2024-07-08 MED ORDER — ASPIRIN 81 MG PO TBEC: 81.0000 mg | DELAYED_RELEASE_TABLET | Freq: Every day | ORAL | Status: AC

## 2024-07-08 NOTE — Patient Instructions (Signed)
 Medication Instructions:  No changes *If you need a refill on your cardiac medications before your next appointment, please call your pharmacy*  Lab Work: Return for lipids, lpa  If you have labs (blood work) drawn today and your tests are completely normal, you will receive your results only by: MyChart Message (if you have MyChart) OR A paper copy in the mail If you have any lab test that is abnormal or we need to change your treatment, we will call you to review the results.   Follow-Up: At [facility], you and your health needs are our priority.  As part of our continuing mission to provide you with exceptional heart care, our providers are all part of one team.  This team includes your primary Cardiologist (physician) and Advanced Practice Providers or APPs (Physician Assistants and Nurse Practitioners) who all work together to provide you with the care you need, when you need it.  Your next appointment:   12 month(s)  Provider:   Shelda Bruckner, MD, Rosaline Bane, NP, or Reche Finder, NP

## 2024-07-08 NOTE — Progress Notes (Signed)
 Cardiology Office Note:  .   Date:  07/08/2024  ID:  James Burns, DOB 1966/04/30, MRN [id number] PCP: Delbert Clam, MD  [facility] Providers Cardiologist:  Shelda Bruckner, MD {  History of Present Illness: .   James Burns is a 58 y.o. 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 I met him 01/19/2023 for evaluation of coronary artery calcification.  Pertinent CV history: CT angio chest 12/27/2022 noted coronary calcifications.   Today: Doing well overall. Exercising, eating more vegetables, cutting back on red meat. Tolerating rosuvastatin , aspirin . He is asking about if he can stop these, see below. Discuss lpa as well.  ROS: Denies chest pain, shortness of breath at rest or with normal exertion. No PND, orthopnea, LE edema or unexpected weight gain. No syncope or palpitations. ROS otherwise negative except as noted.   Studies Reviewed: SABRA    EKG:  EKG Interpretation Date/Time:  Monday July 08 2024 16:37:18 EDT Ventricular Rate:  74 PR Interval:  164 QRS Duration:  84 QT Interval:  384 QTC Calculation: 426 R Axis:   64  Text Interpretation: Normal sinus rhythm Normal ECG When compared with ECG of 17-May-2022 18:56, No significant change was found Confirmed by Bruckner Shelda [phone]) on 07/08/2024 5:08:34 PM    Physical Exam:   VS:  BP 128/78   Pulse 79   Ht 6' 1 (1.854 m)   Wt 180 lb (81.6 kg)   SpO2 99%   BMI 23.75 kg/m    Wt Readings from Last 3 Encounters:  07/08/24 180 lb (81.6 kg)  12/18/23 184 lb (83.5 kg)  01/19/23 183 lb 3.2 oz (83.1 kg)    GEN: Well nourished, well developed in no acute distress HEENT: Normal, moist mucous membranes NECK: No JVD CARDIAC: regular rhythm, normal S1 and S2, no rubs or gallops. No murmur. VASCULAR: Radial and DP pulses 2+ bilaterally. No carotid bruits RESPIRATORY:  Clear to auscultation without rales, wheezing or rhonchi   ABDOMEN: Soft, non-tender, non-distended MUSCULOSKELETAL:  Ambulates independently SKIN: Warm and dry, no edema NEUROLOGIC:  Alert and oriented x 3. No focal neuro deficits noted. PSYCHIATRIC:  Normal affect    ASSESSMENT AND PLAN: .    Coronary artery calcification on CT, consistent with nonobstructive CAD Hypercholesterolemia -last lipids in 01/2023 showed LDL 129 (prior to statin). Repeat lipids have been ordered but not yet performed. Reordered today -LDL goal <70 -continue aspirin  81 mg daily. Currently on rosuvastatin  5 mg daily, adjust based on results of lipids -discussed lpa today, he is amenable -we discussed data and guidelines for both statin and aspirin . He is tolerating both. Of these, low threshold to stop aspirin  if he develops side effects. Discussed data for statins in risk reduction. 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 Heterozygous PT gene mutation. If he has recurrent thromboembolism, would need lifelong anticoagulation  CV risk counseling and prevention -recommend heart healthy/Mediterranean diet, with whole grains, fruits, vegetable, fish, lean meats, nuts, and olive oil. Limit salt. -recommend moderate walking, 3-5 times/week for 30-50 minutes each session. Aim for at least 150 minutes/week. Goal should be pace of 3 miles/hours, or walking 1.5 miles in 30 minutes -recommend avoidance of tobacco products. Avoid excess alcohol.  Dispo: 1 year or sooner as needed  Signed, Shelda Bruckner, MD   Shelda Bruckner, MD, PhD, [facility] [place]  [facility]  [place]  Heart & Vascular at [facility] at [facility] [address] [place], [place] [address] [phone]

## 2024-07-10 ENCOUNTER — Encounter: Payer: Self-pay | Admitting: Family Medicine

## 2024-07-16 DIAGNOSIS — E7800 Pure hypercholesterolemia, unspecified: Secondary | ICD-10-CM | POA: Diagnosis not present

## 2024-07-17 LAB — LIPOPROTEIN A (LPA): Lipoprotein (a): 34.9 nmol/L (ref <75.0)

## 2024-07-17 LAB — LIPID PANEL
Chol/HDL Ratio: 2.8 ratio (ref 0.0–5.0)
Cholesterol, Total: 147 mg/dL (ref 100–199)
HDL: 52 mg/dL (ref >39)
LDL Chol Calc (NIH): 82 mg/dL (ref 0–99)
Triglycerides: 65 mg/dL (ref 0–149)
VLDL Cholesterol Cal: 13 mg/dL (ref 5–40)

## 2024-07-23 ENCOUNTER — Other Ambulatory Visit: Payer: Self-pay

## 2024-07-23 ENCOUNTER — Other Ambulatory Visit (HOSPITAL_BASED_OUTPATIENT_CLINIC_OR_DEPARTMENT_OTHER): Payer: Self-pay

## 2024-07-26 ENCOUNTER — Other Ambulatory Visit (HOSPITAL_BASED_OUTPATIENT_CLINIC_OR_DEPARTMENT_OTHER): Payer: Self-pay | Admitting: Cardiology

## 2024-07-26 ENCOUNTER — Other Ambulatory Visit (HOSPITAL_BASED_OUTPATIENT_CLINIC_OR_DEPARTMENT_OTHER): Payer: Self-pay

## 2024-07-26 DIAGNOSIS — I2510 Atherosclerotic heart disease of native coronary artery without angina pectoris: Secondary | ICD-10-CM

## 2024-07-26 MED ORDER — ROSUVASTATIN CALCIUM 5 MG PO TABS
5.0000 mg | ORAL_TABLET | Freq: Every day | ORAL | 3 refills | Status: DC
  Filled 2024-07-26: qty 90, 90d supply, fill #0

## 2024-07-27 ENCOUNTER — Other Ambulatory Visit (HOSPITAL_BASED_OUTPATIENT_CLINIC_OR_DEPARTMENT_OTHER): Payer: Self-pay

## 2024-08-06 ENCOUNTER — Ambulatory Visit (HOSPITAL_BASED_OUTPATIENT_CLINIC_OR_DEPARTMENT_OTHER): Payer: Self-pay | Admitting: Family

## 2024-08-06 DIAGNOSIS — E7800 Pure hypercholesterolemia, unspecified: Secondary | ICD-10-CM

## 2024-08-06 DIAGNOSIS — Z5181 Encounter for therapeutic drug level monitoring: Secondary | ICD-10-CM

## 2024-08-08 ENCOUNTER — Other Ambulatory Visit: Payer: Self-pay

## 2024-08-08 ENCOUNTER — Other Ambulatory Visit (HOSPITAL_BASED_OUTPATIENT_CLINIC_OR_DEPARTMENT_OTHER): Payer: Self-pay

## 2024-08-08 MED ORDER — ROSUVASTATIN CALCIUM 10 MG PO TABS
10.0000 mg | ORAL_TABLET | Freq: Every day | ORAL | 3 refills | Status: AC
Start: 2024-08-08 — End: ?
  Filled 2024-08-08: qty 90, 90d supply, fill #0
